# Patient Record
Sex: Male | Born: 1945 | Race: White | Hispanic: No | Marital: Married | Smoking: Former smoker
Health system: Southern US, Community
[De-identification: ages and names within clinical notes are randomized; demographics above are authoritative.]

## PROBLEM LIST (undated history)

## (undated) DIAGNOSIS — M1990 Unspecified osteoarthritis, unspecified site: Secondary | ICD-10-CM

## (undated) DIAGNOSIS — Z889 Allergy status to unspecified drugs, medicaments and biological substances status: Secondary | ICD-10-CM

## (undated) DIAGNOSIS — I499 Cardiac arrhythmia, unspecified: Secondary | ICD-10-CM

## (undated) DIAGNOSIS — I10 Essential (primary) hypertension: Secondary | ICD-10-CM

## (undated) DIAGNOSIS — E119 Type 2 diabetes mellitus without complications: Secondary | ICD-10-CM

## (undated) DIAGNOSIS — E0590 Thyrotoxicosis, unspecified without thyrotoxic crisis or storm: Secondary | ICD-10-CM

## (undated) DIAGNOSIS — K219 Gastro-esophageal reflux disease without esophagitis: Secondary | ICD-10-CM

## (undated) DIAGNOSIS — N200 Calculus of kidney: Secondary | ICD-10-CM

## (undated) HISTORY — PX: CERVICAL FUSION: SHX112

## (undated) HISTORY — PX: CARDIAC CATHETERIZATION: SHX172

## (undated) HISTORY — PX: APPENDECTOMY: SHX54

## (undated) HISTORY — PX: KNEE ARTHROSCOPY: SUR90

## (undated) HISTORY — PX: FRACTURE SURGERY: SHX138

## (undated) HISTORY — PX: TONSILLECTOMY: SUR1361

## (undated) HISTORY — PX: HERNIA REPAIR: SHX51

---

## 1997-08-21 ENCOUNTER — Ambulatory Visit (HOSPITAL_COMMUNITY): Admission: RE | Admit: 1997-08-21 | Discharge: 1997-08-21 | Payer: Self-pay | Admitting: Neurosurgery

## 1997-10-09 ENCOUNTER — Encounter: Payer: Self-pay | Admitting: Neurosurgery

## 1997-10-09 ENCOUNTER — Ambulatory Visit (HOSPITAL_COMMUNITY): Admission: RE | Admit: 1997-10-09 | Discharge: 1997-10-09 | Payer: Self-pay | Admitting: Neurosurgery

## 2003-09-18 ENCOUNTER — Other Ambulatory Visit: Payer: Self-pay

## 2004-02-22 ENCOUNTER — Ambulatory Visit: Payer: Self-pay | Admitting: Unknown Physician Specialty

## 2004-03-02 ENCOUNTER — Ambulatory Visit: Payer: Self-pay | Admitting: Unknown Physician Specialty

## 2004-03-30 ENCOUNTER — Ambulatory Visit: Payer: Self-pay | Admitting: Unknown Physician Specialty

## 2004-05-28 ENCOUNTER — Emergency Department: Payer: Self-pay | Admitting: Emergency Medicine

## 2004-10-05 ENCOUNTER — Emergency Department: Payer: Self-pay | Admitting: Emergency Medicine

## 2004-10-05 ENCOUNTER — Other Ambulatory Visit: Payer: Self-pay

## 2004-11-14 ENCOUNTER — Other Ambulatory Visit: Payer: Self-pay

## 2004-11-14 ENCOUNTER — Emergency Department: Payer: Self-pay | Admitting: Unknown Physician Specialty

## 2005-04-11 ENCOUNTER — Other Ambulatory Visit: Payer: Self-pay

## 2005-04-11 ENCOUNTER — Inpatient Hospital Stay: Payer: Self-pay | Admitting: Internal Medicine

## 2005-12-30 ENCOUNTER — Emergency Department: Payer: Self-pay | Admitting: Emergency Medicine

## 2005-12-30 ENCOUNTER — Other Ambulatory Visit: Payer: Self-pay

## 2007-02-18 ENCOUNTER — Ambulatory Visit: Payer: Self-pay | Admitting: Urology

## 2008-02-10 ENCOUNTER — Ambulatory Visit: Payer: Self-pay | Admitting: Family Medicine

## 2008-02-20 ENCOUNTER — Ambulatory Visit: Payer: Self-pay | Admitting: Family Medicine

## 2012-01-25 ENCOUNTER — Other Ambulatory Visit: Payer: Self-pay | Admitting: Surgical

## 2012-01-25 ENCOUNTER — Encounter (HOSPITAL_COMMUNITY): Payer: Self-pay | Admitting: Pharmacy Technician

## 2012-01-25 NOTE — Progress Notes (Signed)
Dr Darrelyn Hillock-  Need PRE OP ORDERS PLACED IN EPIC- PLEASE- Pt has appt 01/26/12   [facility]

## 2012-01-26 ENCOUNTER — Ambulatory Visit (HOSPITAL_COMMUNITY)
Admission: RE | Admit: 2012-01-26 | Discharge: 2012-01-26 | Disposition: A | Discharge status: Home / Self Care | Payer: PRIVATE HEALTH INSURANCE | Source: Ambulatory Visit | Attending: Surgical | Admitting: Surgical

## 2012-01-26 ENCOUNTER — Encounter (HOSPITAL_COMMUNITY)
Admission: RE | Admit: 2012-01-26 | Discharge: 2012-01-26 | Disposition: A | Discharge status: Home / Self Care | Payer: PRIVATE HEALTH INSURANCE | Source: Ambulatory Visit | Attending: Orthopedic Surgery | Admitting: Orthopedic Surgery

## 2012-01-26 ENCOUNTER — Encounter (HOSPITAL_COMMUNITY): Payer: Self-pay

## 2012-01-26 DIAGNOSIS — Z01818 Encounter for other preprocedural examination: Secondary | ICD-10-CM | POA: Insufficient documentation

## 2012-01-26 DIAGNOSIS — Z889 Allergy status to unspecified drugs, medicaments and biological substances status: Secondary | ICD-10-CM

## 2012-01-26 HISTORY — DX: Gastro-esophageal reflux disease without esophagitis: K21.9

## 2012-01-26 HISTORY — DX: Allergy status to unspecified drugs, medicaments and biological substances status: Z88.9

## 2012-01-26 HISTORY — DX: Type 2 diabetes mellitus without complications: E11.9

## 2012-01-26 HISTORY — DX: Cardiac arrhythmia, unspecified: I49.9

## 2012-01-26 HISTORY — DX: Allergy status to unspecified drugs, medicaments and biological substances: Z88.9

## 2012-01-26 HISTORY — DX: Calculus of kidney: N20.0

## 2012-01-26 HISTORY — DX: Thyrotoxicosis, unspecified without thyrotoxic crisis or storm: E05.90

## 2012-01-26 HISTORY — DX: Unspecified osteoarthritis, unspecified site: M19.90

## 2012-01-26 HISTORY — DX: Essential (primary) hypertension: I10

## 2012-01-26 LAB — URINALYSIS, ROUTINE W REFLEX MICROSCOPIC
Bilirubin Urine: NEGATIVE
Glucose, UA: 100 mg/dL — AB
Hgb urine dipstick: NEGATIVE
Ketones, ur: NEGATIVE mg/dL
Leukocytes, UA: NEGATIVE
Nitrite: NEGATIVE
Protein, ur: NEGATIVE mg/dL
Specific Gravity, Urine: 1.011 (ref 1.005–1.030)
Urobilinogen, UA: 0.2 mg/dL (ref 0.0–1.0)
pH: 6 (ref 5.0–8.0)

## 2012-01-26 LAB — PROTIME-INR
INR: 1.08 (ref 0.00–1.49)
Prothrombin Time: 13.9 seconds (ref 11.6–15.2)

## 2012-01-26 LAB — COMPREHENSIVE METABOLIC PANEL
ALT: 24 U/L (ref 0–53)
AST: 24 U/L (ref 0–37)
Albumin: 4.1 g/dL (ref 3.5–5.2)
Alkaline Phosphatase: 72 U/L (ref 39–117)
BUN: 14 mg/dL (ref 6–23)
CO2: 28 mEq/L (ref 19–32)
Calcium: 9.8 mg/dL (ref 8.4–10.5)
Chloride: 100 mEq/L (ref 96–112)
Creatinine, Ser: 0.86 mg/dL (ref 0.50–1.35)
GFR calc Af Amer: >90 mL/min (ref >90)
GFR calc non Af Amer: 90 mL/min — ABNORMAL LOW (ref >90)
Glucose, Bld: 148 mg/dL — ABNORMAL HIGH (ref 70–99)
Potassium: 4.4 mEq/L (ref 3.5–5.1)
Sodium: 136 mEq/L (ref 135–145)
Total Bilirubin: 0.3 mg/dL (ref 0.3–1.2)
Total Protein: 7.4 g/dL (ref 6.0–8.3)

## 2012-01-26 LAB — CBC
MCH: 32 pg (ref 26.0–34.0)
MCHC: 35.1 g/dL (ref 30.0–36.0)
MCV: 91.4 fL (ref 78.0–100.0)
Platelets: 234 10*3/uL (ref 150–400)
RBC: 4.65 MIL/uL (ref 4.22–5.81)
RDW: 12.4 % (ref 11.5–15.5)

## 2012-01-26 LAB — APTT: aPTT: 28 seconds (ref 24–37)

## 2012-01-26 LAB — SURGICAL PCR SCREEN: MRSA, PCR: NEGATIVE

## 2012-01-26 NOTE — Patient Instructions (Addendum)
22 Stephen Beasley  01/26/2012   Your procedure is scheduled on: 12-31--2013  Report to [facility] at      1000  AM   Call this number if you have problems the morning of surgery: [phone]  Or Presurgical Testing [phone](Donita Newland)   Remember: Bring any necessary supplies to manage Insulin Pump. Advise RN's at all times when making any changes to your insulin pump rate.    Do not eat food:After Midnight.  May have clear liquids:up to 6 Hours before arrival. Nothing after : 0700 AM  Clear liquids include soda, tea, black coffee, apple or grape juice, broth.  Take these medicines the morning of surgery with A SIP OF WATER:  Tylenol. Clonazepam. Nexium. Levothyroxine. Simvastatin. Sotalol. Bring and use Advair inhaler and Nasonex nasal spray.  Do not wear jewelry, make-up or nail polish.  Do not wear lotions, powders, or perfumes. You may wear deodorant.  Do not shave 48 hours prior to surgery.(face and neck okay, no shaving of legs)  Do not bring valuables to the hospital.  Contacts, dentures or bridgework,body piercing,  may not be worn into surgery.  Leave suitcase in the car. After surgery it may be brought to your room.  For patients admitted to the hospital, checkout time is 11:00 AM the day of discharge.   Patients discharged the day of surgery will not be allowed to drive home. Must have responsible person with you x 24 hours once discharged.  Name and phone number of your driver: Mia Winthrop, spouse [phone] cell  Special Instructions: CHG Shower Use Special Wash: see special instructions.(avoid face and genitals)   Please read over the following fact sheets that you were given: MRSA Information,  Incentive Spirometry Instruction.    Failure to follow these instructions may result in Cancellation of your surgery.   Patient signature_______________________________________________________

## 2012-01-26 NOTE — Pre-Procedure Instructions (Addendum)
01-26-12- EKG/ CXR done today. 01-26-12 Discussed Insulin Pump routine with Dr. Quincy Carnes MD not available between 01-25-12 to 02-01-12, MD has communicated promptly with pt. Via his smartphone, saying he was able to manage his own Insulin pump. I faxed and asked MD to send note by fax or to my email address. Pt. Signed Insulin pump contract. Saunders Glance 01-29-12 0950 -no email info or fax info received from endocrinologist MD.W. Zhanna Melin,RN Diabetes Coordinator @ [phone] made aware of pt. Having surgery.

## 2012-01-28 NOTE — H&P (Signed)
Stephen Beasley is an 66 y.o. male.   Chief Complaint: left shoulder pain HPI: The patient is a 66 year old male who presented with the chief complaint of left shoulder pain for several months with no known injury. He did responded minimally to cortisone injections. He was experiencing decreased strength as well as motion. MRI showed torn rotator cuff in the left shoulder with advanced AC joint arthritis.   Past Medical History  Diagnosis Date  . Diabetes mellitus without complication     Insulin Pump (Medtronic Paradigm) being used  . Hypertension   . Dysrhythmia     Premature PVC's, and skipped beats- Dr. Arvilla Market follows -[facility]  . Hyperthyroidism   . Multiple allergies 01-26-12    Advair for allergies  . Kidney stone     40 yrs ago  . GERD (gastroesophageal reflux disease)   . Arthritis     mild hands, shoulders    Past Surgical History  Procedure Date  . Cardiac catheterization     6 yrs ago -negative for blockages  . Tonsillectomy   . Fracture surgery     Right ankle retained hardware  . Appendectomy   . Hernia repair     RIH x1, Umbilical X2  . Knee arthroscopy     right knee  . Cervical fusion     no retained hardware    No family history on file. Social History:  reports that he quit smoking about 30 years ago. His smoking use included Cigarettes. He smoked .5 packs per day. He does not have any smokeless tobacco history on file. He reports that he drinks alcohol. He reports that he does not use illicit drugs.  Allergies:  Allergies  Allergen Reactions  . Augmentin (Amoxicillin-Pot Clavulanate) Nausea And Vomiting  . Chocolate Nausea And Vomiting    White chocolate  . Lipitor (Atorvastatin) Other (See Comments)    Muscle cramps   . Current outpatient prescriptions: acetaminophen (TYLENOL) 500 MG tablet, Take 1,000 mg by mouth every 6 (six) hours as needed. For pain, Disp: , Rfl: ;   aspirin 325 MG tablet, Take 325 mg by mouth daily., Disp: , Rfl: ;   clonazePAM (KLONOPIN) 1 MG tablet, Take 1 mg by mouth 2 (two) times daily as needed. For anxiety, Disp: , Rfl: ;   esomeprazole (NEXIUM) 40 MG capsule, Take 40 mg by mouth daily before breakfast., Disp: , Rfl:  Fluticasone-Salmeterol (ADVAIR) 250-50 MCG/DOSE AEPB, Inhale 1 puff into the lungs daily., Disp: , Rfl: ;   insulin aspart (NOVOLOG) 100 UNIT/ML injection, Inject into the skin See admin instructions. For insulin pump. Rate of 1.8 units per hour. Sugar is below 60, stop insulin. Use sliding scale around meals depending on carb intake. Varies between 3 units and 19 units., Disp: , Rfl:  levothyroxine (SYNTHROID, LEVOTHROID) 137 MCG tablet, Take 137 mcg by mouth every morning., Disp: , Rfl: ;  mometasone (NASONEX) 50 MCG/ACT nasal spray, Place 2 sprays into the nose daily., Disp: , Rfl: ;  Multiple Vitamin (MULTIVITAMIN WITH MINERALS) TABS, Take 1 tablet by mouth daily., Disp: , Rfl: ;  ramipril (ALTACE) 10 MG capsule, Take 20 mg by mouth daily., Disp: , Rfl:  simvastatin (ZOCOR) 20 MG tablet, Take 20 mg by mouth every morning., Disp: , Rfl: ;   sotalol (BETAPACE) 80 MG tablet, Take 80 mg by mouth 2 (two) times daily., Disp: , Rfl:   Review of Systems  Constitutional: Negative.   HENT: Negative.  Negative for  neck pain.   Eyes: Negative.   Respiratory: Negative.   Cardiovascular: Negative.   Gastrointestinal: Negative.   Genitourinary: Negative.   Musculoskeletal: Positive for joint pain. Negative for myalgias, back pain and falls.       Left shoulder pain  Skin: Negative.   Neurological: Negative.   Endo/Heme/Allergies: Negative.   Psychiatric/Behavioral: Negative.     Vitals Weight: 255 lb Height: 76 in Body Surface Area: 2.49 m Body Mass Index: 31.04 kg/m Pulse: 60 (Regular) BP: 125/86 (Sitting, Left Arm, Standard  Physical Exam  Constitutional: He is oriented to person, place, and time. He appears well-developed and well-nourished. No distress.  HENT:  Head:  Normocephalic and atraumatic.  Right Ear: External ear normal.  Left Ear: External ear normal.  Nose: Nose normal.  Mouth/Throat: Oropharynx is clear and moist.  Eyes: Conjunctivae normal and EOM are normal.  Neck: Normal range of motion. Neck supple. No tracheal deviation present. No thyromegaly present.  Cardiovascular: Normal rate, regular rhythm, normal heart sounds and intact distal pulses.   No murmur heard. Respiratory: Effort normal and breath sounds normal. No respiratory distress. He has no wheezes. He exhibits no tenderness.  GI: Soft. Bowel sounds are normal. He exhibits no distension and no mass. There is no tenderness.  Musculoskeletal:       Right shoulder: Normal.       Left shoulder: He exhibits decreased range of motion, tenderness, crepitus, pain and decreased strength.       Right elbow: Normal.      Left elbow: Normal.       Cervical back: Normal.       Arms: Lymphadenopathy:    He has no cervical adenopathy.  Neurological: He is alert and oriented to person, place, and time. He has normal reflexes. No sensory deficit.       Decreased strength in the left shoulder  Skin: No rash noted. He is not diaphoretic. No erythema.  Psychiatric: He has a normal mood and affect. His behavior is normal.     Assessment/Plan He has a torn rotator cuff at the insertion of the cuff in the left shoulder in addition to advanced AC joint arthritis. He needs an open left shoulder rotator cuff repair with decompression and resection of AC joint, with possible use of graft and anchors. He will stay in observation overnight for this.     Luanne Krzyzanowski LAUREN 01/28/2012, 4:59 PM

## 2012-01-30 ENCOUNTER — Encounter (HOSPITAL_COMMUNITY)
Admission: RE | Disposition: A | Discharge status: Home / Self Care | Payer: Self-pay | Source: Ambulatory Visit | Attending: Orthopedic Surgery

## 2012-01-30 ENCOUNTER — Observation Stay (HOSPITAL_COMMUNITY)
Admission: RE | Admit: 2012-01-30 | Discharge: 2012-01-31 | Disposition: A | Discharge status: Home / Self Care | Payer: PRIVATE HEALTH INSURANCE | Source: Ambulatory Visit | Attending: Orthopedic Surgery | Admitting: Orthopedic Surgery

## 2012-01-30 ENCOUNTER — Encounter (HOSPITAL_COMMUNITY): Payer: Self-pay | Admitting: Anesthesiology

## 2012-01-30 ENCOUNTER — Encounter (HOSPITAL_COMMUNITY): Payer: Self-pay | Admitting: *Deleted

## 2012-01-30 ENCOUNTER — Ambulatory Visit (HOSPITAL_COMMUNITY): Payer: PRIVATE HEALTH INSURANCE | Admitting: Anesthesiology

## 2012-01-30 DIAGNOSIS — M25819 Other specified joint disorders, unspecified shoulder: Secondary | ICD-10-CM | POA: Insufficient documentation

## 2012-01-30 DIAGNOSIS — Z79899 Other long term (current) drug therapy: Secondary | ICD-10-CM | POA: Insufficient documentation

## 2012-01-30 DIAGNOSIS — Z7982 Long term (current) use of aspirin: Secondary | ICD-10-CM | POA: Insufficient documentation

## 2012-01-30 DIAGNOSIS — I10 Essential (primary) hypertension: Secondary | ICD-10-CM | POA: Insufficient documentation

## 2012-01-30 DIAGNOSIS — M7542 Impingement syndrome of left shoulder: Secondary | ICD-10-CM | POA: Diagnosis present

## 2012-01-30 DIAGNOSIS — M19019 Primary osteoarthritis, unspecified shoulder: Secondary | ICD-10-CM | POA: Diagnosis present

## 2012-01-30 DIAGNOSIS — E109 Type 1 diabetes mellitus without complications: Secondary | ICD-10-CM

## 2012-01-30 DIAGNOSIS — M67919 Unspecified disorder of synovium and tendon, unspecified shoulder: Principal | ICD-10-CM | POA: Insufficient documentation

## 2012-01-30 DIAGNOSIS — K219 Gastro-esophageal reflux disease without esophagitis: Secondary | ICD-10-CM | POA: Insufficient documentation

## 2012-01-30 DIAGNOSIS — E119 Type 2 diabetes mellitus without complications: Secondary | ICD-10-CM | POA: Insufficient documentation

## 2012-01-30 DIAGNOSIS — M719 Bursopathy, unspecified: Principal | ICD-10-CM | POA: Insufficient documentation

## 2012-01-30 HISTORY — PX: SHOULDER OPEN ROTATOR CUFF REPAIR: SHX2407

## 2012-01-30 LAB — GLUCOSE, CAPILLARY: Glucose-Capillary: 271 mg/dL — ABNORMAL HIGH (ref 70–99)

## 2012-01-30 SURGERY — REPAIR, ROTATOR CUFF, OPEN
Anesthesia: General | Site: Shoulder | Laterality: Left | Wound class: Clean

## 2012-01-30 MED ORDER — ACETAMINOPHEN 325 MG PO TABS: 650.0000 mg | ORAL_TABLET | Freq: Four times a day (QID) | ORAL | Status: DC | PRN

## 2012-01-30 MED ORDER — POLYETHYLENE GLYCOL 3350 17 G PO PACK: 17.0000 g | PACK | Freq: Every day | ORAL | Status: DC | PRN

## 2012-01-30 MED ORDER — LACTATED RINGERS IV SOLN
INTRAVENOUS | Status: DC | PRN
  Administered 2012-01-30 (×2): via INTRAVENOUS

## 2012-01-30 MED ORDER — CLINDAMYCIN PHOSPHATE 600 MG/50ML IV SOLN
600.0000 mg | Freq: Four times a day (QID) | INTRAVENOUS | Status: AC
  Administered 2012-01-30 – 2012-01-31 (×3): 600 mg via INTRAVENOUS
  Filled 2012-01-30 (×3): qty 50

## 2012-01-30 MED ORDER — RAMIPRIL 10 MG PO CAPS
20.0000 mg | ORAL_CAPSULE | Freq: Every day | ORAL | Status: DC
  Administered 2012-01-31: 20 mg via ORAL
  Filled 2012-01-30 (×2): qty 2

## 2012-01-30 MED ORDER — LIDOCAINE HCL (CARDIAC) 20 MG/ML IV SOLN
INTRAVENOUS | Status: DC | PRN
  Administered 2012-01-30: 60 mg via INTRAVENOUS

## 2012-01-30 MED ORDER — ONDANSETRON HCL 4 MG PO TABS: 4.0000 mg | ORAL_TABLET | Freq: Four times a day (QID) | ORAL | Status: DC | PRN

## 2012-01-30 MED ORDER — FENTANYL CITRATE 0.05 MG/ML IJ SOLN
INTRAMUSCULAR | Status: DC | PRN
  Administered 2012-01-30: 100 ug via INTRAVENOUS

## 2012-01-30 MED ORDER — PROMETHAZINE HCL 25 MG/ML IJ SOLN: 6.2500 mg | INTRAMUSCULAR | Status: DC | PRN

## 2012-01-30 MED ORDER — LACTATED RINGERS IV SOLN
INTRAVENOUS | Status: DC
  Administered 2012-01-30 – 2012-01-31 (×2): via INTRAVENOUS

## 2012-01-30 MED ORDER — BISACODYL 10 MG RE SUPP: 10.0000 mg | Freq: Every day | RECTAL | Status: DC | PRN

## 2012-01-30 MED ORDER — INSULIN ASPART 100 UNIT/ML ~~LOC~~ SOLN: 0.0000 [IU] | Freq: Three times a day (TID) | SUBCUTANEOUS | Status: DC

## 2012-01-30 MED ORDER — SUCCINYLCHOLINE CHLORIDE 20 MG/ML IJ SOLN
INTRAMUSCULAR | Status: DC | PRN
  Administered 2012-01-30: 140 mg via INTRAVENOUS

## 2012-01-30 MED ORDER — ACETAMINOPHEN 10 MG/ML IV SOLN
INTRAVENOUS | Status: DC | PRN
  Administered 2012-01-30: 1000 mg via INTRAVENOUS

## 2012-01-30 MED ORDER — INSULIN GLARGINE 100 UNIT/ML ~~LOC~~ SOLN: 36.0000 [IU] | Freq: Every day | SUBCUTANEOUS | Status: DC

## 2012-01-30 MED ORDER — MOMETASONE FURO-FORMOTEROL FUM 100-5 MCG/ACT IN AERO
2.0000 | INHALATION_SPRAY | Freq: Two times a day (BID) | RESPIRATORY_TRACT | Status: DC
  Administered 2012-01-30: 2 via RESPIRATORY_TRACT
  Filled 2012-01-30: qty 8.8

## 2012-01-30 MED ORDER — OXYCODONE-ACETAMINOPHEN 5-325 MG PO TABS
1.0000 | ORAL_TABLET | ORAL | Status: DC | PRN
  Administered 2012-01-30: 1 via ORAL
  Administered 2012-01-31: 2 via ORAL
  Filled 2012-01-30: qty 2
  Filled 2012-01-30: qty 1

## 2012-01-30 MED ORDER — ONDANSETRON HCL 4 MG/2ML IJ SOLN
INTRAMUSCULAR | Status: DC | PRN
  Administered 2012-01-30: 4 mg via INTRAVENOUS

## 2012-01-30 MED ORDER — INSULIN PUMP
Freq: Three times a day (TID) | SUBCUTANEOUS | Status: DC
  Administered 2012-01-30 – 2012-01-31 (×3): via SUBCUTANEOUS
  Filled 2012-01-30: qty 1

## 2012-01-30 MED ORDER — SODIUM CHLORIDE 0.9 % IR SOLN
Status: DC | PRN
  Administered 2012-01-30: 14:00:00

## 2012-01-30 MED ORDER — METHOCARBAMOL 100 MG/ML IJ SOLN
500.0000 mg | Freq: Four times a day (QID) | INTRAVENOUS | Status: DC | PRN
  Filled 2012-01-30: qty 5

## 2012-01-30 MED ORDER — SIMVASTATIN 20 MG PO TABS
20.0000 mg | ORAL_TABLET | Freq: Every evening | ORAL | Status: DC
  Filled 2012-01-30: qty 1

## 2012-01-30 MED ORDER — HYDROMORPHONE HCL PF 1 MG/ML IJ SOLN: 0.2500 mg | INTRAMUSCULAR | Status: DC | PRN

## 2012-01-30 MED ORDER — SOTALOL HCL 80 MG PO TABS
80.0000 mg | ORAL_TABLET | Freq: Two times a day (BID) | ORAL | Status: DC
  Administered 2012-01-30 – 2012-01-31 (×2): 80 mg via ORAL
  Filled 2012-01-30 (×4): qty 1

## 2012-01-30 MED ORDER — HYDROMORPHONE HCL PF 1 MG/ML IJ SOLN
INTRAMUSCULAR | Status: DC | PRN
  Administered 2012-01-30 (×2): 1 mg via INTRAVENOUS

## 2012-01-30 MED ORDER — FLEET ENEMA 7-19 GM/118ML RE ENEM: 1.0000 | ENEMA | Freq: Once | RECTAL | Status: AC | PRN

## 2012-01-30 MED ORDER — LEVOTHYROXINE SODIUM 137 MCG PO TABS
137.0000 ug | ORAL_TABLET | Freq: Every day | ORAL | Status: DC
  Administered 2012-01-31: 137 ug via ORAL
  Filled 2012-01-30 (×2): qty 1

## 2012-01-30 MED ORDER — METHOCARBAMOL 500 MG PO TABS
500.0000 mg | ORAL_TABLET | Freq: Four times a day (QID) | ORAL | Status: DC | PRN
  Administered 2012-01-31 (×2): 500 mg via ORAL
  Filled 2012-01-30 (×2): qty 1

## 2012-01-30 MED ORDER — INSULIN ASPART 100 UNIT/ML ~~LOC~~ SOLN
0.0000 [IU] | SUBCUTANEOUS | Status: DC
  Administered 2012-01-30: 11 [IU] via SUBCUTANEOUS
  Filled 2012-01-30: qty 1

## 2012-01-30 MED ORDER — OXYCODONE-ACETAMINOPHEN 5-325 MG PO TABS: 2.0000 | ORAL_TABLET | ORAL | Status: DC | PRN

## 2012-01-30 MED ORDER — MIDAZOLAM HCL 5 MG/5ML IJ SOLN
INTRAMUSCULAR | Status: DC | PRN
  Administered 2012-01-30 (×2): 1 mg via INTRAVENOUS

## 2012-01-30 MED ORDER — LACTATED RINGERS IV SOLN
INTRAVENOUS | Status: DC
  Administered 2012-01-30: 11:00:00 via INTRAVENOUS

## 2012-01-30 MED ORDER — ONDANSETRON HCL 4 MG/2ML IJ SOLN: 4.0000 mg | Freq: Four times a day (QID) | INTRAMUSCULAR | Status: DC | PRN

## 2012-01-30 MED ORDER — HYDROMORPHONE HCL PF 1 MG/ML IJ SOLN
0.5000 mg | INTRAMUSCULAR | Status: DC | PRN
  Administered 2012-01-30: 1 mg via INTRAVENOUS
  Filled 2012-01-30: qty 1

## 2012-01-30 MED ORDER — CLINDAMYCIN PHOSPHATE 900 MG/50ML IV SOLN
900.0000 mg | INTRAVENOUS | Status: AC
  Administered 2012-01-30: 900 mg via INTRAVENOUS
  Filled 2012-01-30: qty 50

## 2012-01-30 MED ORDER — PANTOPRAZOLE SODIUM 40 MG PO TBEC
40.0000 mg | DELAYED_RELEASE_TABLET | Freq: Every day | ORAL | Status: DC
  Administered 2012-01-31: 40 mg via ORAL
  Filled 2012-01-30: qty 1

## 2012-01-30 MED ORDER — BUPIVACAINE LIPOSOME 1.3 % IJ SUSP
20.0000 mL | INTRAMUSCULAR | Status: AC
  Administered 2012-01-30: 20 mL
  Filled 2012-01-30: qty 20

## 2012-01-30 MED ORDER — PHENOL 1.4 % MT LIQD: 1.0000 | OROMUCOSAL | Status: DC | PRN

## 2012-01-30 MED ORDER — LACTATED RINGERS IV SOLN: INTRAVENOUS | Status: DC

## 2012-01-30 MED ORDER — CLONAZEPAM 1 MG PO TABS
1.0000 mg | ORAL_TABLET | Freq: Two times a day (BID) | ORAL | Status: DC | PRN
  Administered 2012-01-30 – 2012-01-31 (×2): 1 mg via ORAL
  Filled 2012-01-30 (×2): qty 1

## 2012-01-30 MED ORDER — ACETAMINOPHEN 650 MG RE SUPP: 650.0000 mg | Freq: Four times a day (QID) | RECTAL | Status: DC | PRN

## 2012-01-30 MED ORDER — FLUTICASONE PROPIONATE 50 MCG/ACT NA SUSP
2.0000 | Freq: Every day | NASAL | Status: DC
  Administered 2012-01-31: 2 via NASAL
  Filled 2012-01-30: qty 16

## 2012-01-30 MED ORDER — MENTHOL 3 MG MT LOZG: 1.0000 | LOZENGE | OROMUCOSAL | Status: DC | PRN

## 2012-01-30 MED ORDER — THROMBIN 5000 UNITS EX SOLR
CUTANEOUS | Status: DC | PRN
  Administered 2012-01-30: 5000 [IU] via TOPICAL

## 2012-01-30 MED ORDER — PROPOFOL 10 MG/ML IV BOLUS
INTRAVENOUS | Status: DC | PRN
  Administered 2012-01-30: 200 mg via INTRAVENOUS

## 2012-01-30 SURGICAL SUPPLY — 48 items
BAG SPEC THK2 15X12 ZIP CLS (MISCELLANEOUS) ×1
BAG ZIPLOCK 12X15 (MISCELLANEOUS) ×2 IMPLANT
BLADE OSCILLATING/SAGITTAL (BLADE) ×2
BLADE SW THK.38XMED LNG THN (BLADE) ×1 IMPLANT
BNDG COHESIVE 6X5 TAN NS LF (GAUZE/BANDAGES/DRESSINGS) ×2 IMPLANT
BUR OVAL CARBIDE 4.0 (BURR) ×2 IMPLANT
CLEANER TIP ELECTROSURG 2X2 (MISCELLANEOUS) ×2 IMPLANT
CLOTH BEACON ORANGE TIMEOUT ST (SAFETY) ×2 IMPLANT
CLSR STERI-STRIP ANTIMIC 1/2X4 (GAUZE/BANDAGES/DRESSINGS) ×2 IMPLANT
DRAPE POUCH INSTRU U-SHP 10X18 (DRAPES) ×2 IMPLANT
DRSG EMULSION OIL 3X3 NADH (GAUZE/BANDAGES/DRESSINGS) ×2 IMPLANT
DRSG PAD ABDOMINAL 8X10 ST (GAUZE/BANDAGES/DRESSINGS) ×2 IMPLANT
DURAPREP 26ML APPLICATOR (WOUND CARE) ×2 IMPLANT
ELECT REM PT RETURN 9FT ADLT (ELECTROSURGICAL) ×2
ELECTRODE REM PT RTRN 9FT ADLT (ELECTROSURGICAL) ×1 IMPLANT
FLOSEAL 10ML (HEMOSTASIS) IMPLANT
GLOVE BIOGEL PI IND STRL 8 (GLOVE) ×1 IMPLANT
GLOVE BIOGEL PI IND STRL 8.5 (GLOVE) ×1 IMPLANT
GLOVE BIOGEL PI INDICATOR 8 (GLOVE) ×1
GLOVE BIOGEL PI INDICATOR 8.5 (GLOVE) ×1
GLOVE ECLIPSE 8.0 STRL XLNG CF (GLOVE) ×4 IMPLANT
GLOVE SURG SS PI 6.5 STRL IVOR (GLOVE) ×4 IMPLANT
GOWN PREVENTION PLUS LG XLONG (DISPOSABLE) ×4 IMPLANT
GOWN STRL REIN XL XLG (GOWN DISPOSABLE) ×4 IMPLANT
KIT BASIN OR (CUSTOM PROCEDURE TRAY) ×2 IMPLANT
MANIFOLD NEPTUNE II (INSTRUMENTS) ×2 IMPLANT
NEEDLE MA TROC 1/2 (NEEDLE) IMPLANT
NS IRRIG 1000ML POUR BTL (IV SOLUTION) IMPLANT
PACK SHOULDER CUSTOM OPM052 (CUSTOM PROCEDURE TRAY) ×2 IMPLANT
PASSER SUT SWANSON 36MM LOOP (INSTRUMENTS) IMPLANT
POSITIONER SURGICAL ARM (MISCELLANEOUS) ×2 IMPLANT
SLING ARM IMMOBILIZER LRG (SOFTGOODS) ×2 IMPLANT
SPONGE GAUZE 4X4 12PLY (GAUZE/BANDAGES/DRESSINGS) ×2 IMPLANT
SPONGE SURGIFOAM ABS GEL 100 (HEMOSTASIS) ×2 IMPLANT
STAPLER VISISTAT 35W (STAPLE) ×2 IMPLANT
STRIP CLOSURE SKIN 1/2X4 (GAUZE/BANDAGES/DRESSINGS) ×2 IMPLANT
SUCTION FRAZIER 12FR DISP (SUCTIONS) ×2 IMPLANT
SUT BONE WAX W31G (SUTURE) ×2 IMPLANT
SUT ETHIBOND NAB CT1 #1 30IN (SUTURE) IMPLANT
SUT MNCRL AB 4-0 PS2 18 (SUTURE) ×2 IMPLANT
SUT VIC AB 0 CT1 27 (SUTURE) ×2
SUT VIC AB 0 CT1 27XBRD ANTBC (SUTURE) ×1 IMPLANT
SUT VIC AB 1 CT1 27 (SUTURE) ×4
SUT VIC AB 1 CT1 27XBRD ANTBC (SUTURE) ×2 IMPLANT
SUT VIC AB 2-0 CT1 27 (SUTURE)
SUT VIC AB 2-0 CT1 27XBRD (SUTURE) IMPLANT
TAPE CLOTH SURG 6X10 WHT LF (GAUZE/BANDAGES/DRESSINGS) ×2 IMPLANT
TOWEL OR 17X26 10 PK STRL BLUE (TOWEL DISPOSABLE) ×4 IMPLANT

## 2012-01-30 NOTE — H&P (View-Only) (Signed)
Inpatient Diabetes Program Recommendations  AACE/ADA: New Consensus Statement on Inpatient Glycemic Control (2013)  Target Ranges:  Prepandial:   less than 140 mg/dL      Peak postprandial:   less than 180 mg/dL (1-2 hours)      Critically ill patients:  140 - 180 mg/dL   Reason for Visit: Hyperglycemia pre-op in patient that usually uses an insulin pump to manage diabetes.  Note:  Patient scheduled for shoulder surgery today.  Per patient, his endocrinologist from [facility], Dr. Nicole Jelesoff, sent a letter electronically to Dr. Gioffre outlining recommendations for subcutaneous insulin injections while off the insulin pump.  Patient was advised by Dr. Jelesoff to stop his insulin pump last night and take Lantus 36 units.  This morning at home, CBG's reportedly 200 to 220 mg/dl.  Upon arrival on Short Stay, CBG was 300 mg/dl.  Received Novolog 11 units at 10:40 per resistant scale.  Should glucose level not respond in a timely way to subcutaneous insulin, please consider the GlucoStabilizer for IV insulin-- even though his surgery is estimated to last only 1 1/2 hours.  Patient's insulin pump was left at home because Dr. Jelesoff plans for him to resume pump after he returns home.  At first patient states he doesn't remember pump settings-- but later states he receives a flat rate of Novolog 1.5 units hourly for a total of 36 units Novolog every 24 hours as basal insulin.  (Per PA note, patient's rate is 1.8 units/hour.)  Theoretically the dose of Lantus 36 units should have been adequate, but sometimes it takes more than one dose to see full effect of Lantus.  Will need at least Lantus 36 units at HS nightly until discharged-- or dosage as recommended per Dr. Jelesoff.  Patient states that he usually takes 1 unit Novolog to cover every 6 GM CHO.  CHO Modified Medium diet provides 60 GM CHO per meal, so 10 units of meal coverage tid with meals would be home ratio.  Would recommend Novolog 10 units tid  with meals as meal coverage, or dosage recommended per Dr. Jelesoff.   Patient doesn't remember the correction factor set in his bolus calculator, but sounds as though he has insulin resistance, even though he is type 1.  Would recommend resistant or moderate scale, or correction recommended by Dr. Jelesoff.  Thank you.  Codee Bloodworth S. Zahir Eisenhour, RN, CNS, CDE Inpatient Diabetes Program, team pager [phone]        

## 2012-01-30 NOTE — Consult Note (Signed)
[facility] Medical Consultation  Stephen Beasley ZOX:[id number] DOB: 1945-06-17 DOA: 01/30/2012 PCP: Provider Not In System   Requesting physician: Ranee Gosselin Date of consultation: 01/30/12 Reason for consultation: diabetes management   Impression/Recommendations  1. DM type I on an insulin pump at home. We recommend the patient bring his own pump and start using it tonight. If he is unable to set it up then would recommend give 35 units of lantus tonight and place ssi sensitive novolog with meals.  I will followup again tomorrow. Please contact me if I can be of assistance in the meanwhile. Thank you for this consultation.  Chief Complaint: here for shoulder surgery , needs insulin titration for his diabetes   HPI:  66 yo man who underwent surgery on his shoulder today . We were called to help with diabetes management   Review of Systems:  C/o some postop shoulder pain   Past Medical History  Diagnosis Date  . Diabetes mellitus without complication     Insulin Pump (Medtronic Paradigm) being used  . Hypertension   . Dysrhythmia     Premature PVC's, and skipped beats- Dr. Arvilla Market follows -[facility]  . Hyperthyroidism   . Multiple allergies 01-26-12    Advair for allergies  . Kidney stone     40 yrs ago  . GERD (gastroesophageal reflux disease)   . Arthritis     mild hands, shoulders   Past Surgical History  Procedure Date  . Cardiac catheterization     6 yrs ago -negative for blockages  . Tonsillectomy   . Fracture surgery     Right ankle retained hardware  . Appendectomy   . Hernia repair     RIH x1, Umbilical X2  . Knee arthroscopy     right knee  . Cervical fusion     no retained hardware   Social History:  reports that he quit smoking about 30 years ago. His smoking use included Cigarettes. He smoked .5 packs per day. He does not have any smokeless tobacco history on file. He reports that he drinks alcohol. He reports that he does not use illicit  drugs.  Allergies  Allergen Reactions  . Augmentin (Amoxicillin-Pot Clavulanate) Nausea And Vomiting  . Chocolate Nausea And Vomiting    White chocolate  . Lipitor (Atorvastatin) Other (See Comments)    Muscle cramps   History reviewed. No pertinent family history.  Prior to Admission medications   Medication Sig Start Date End Date Taking? Authorizing Provider  acetaminophen (TYLENOL) 500 MG tablet Take 1,000 mg by mouth every 6 (six) hours as needed. For pain   Yes Historical Provider, MD  aspirin 325 MG tablet Take 325 mg by mouth daily.   Yes Historical Provider, MD  clonazePAM (KLONOPIN) 1 MG tablet Take 1 mg by mouth 2 (two) times daily as needed. For anxiety   Yes Historical Provider, MD  esomeprazole (NEXIUM) 40 MG capsule Take 40 mg by mouth daily before breakfast.   Yes Historical Provider, MD  Fluticasone-Salmeterol (ADVAIR) 250-50 MCG/DOSE AEPB Inhale 1 puff into the lungs daily.   Yes Historical Provider, MD  insulin aspart (NOVOLOG) 100 UNIT/ML injection Inject into the skin See admin instructions. For insulin pump. Rate of 1.8 units per hour. Sugar is below 60, stop insulin. Use sliding scale around meals depending on carb intake. Varies between 3 units and 19 units.   Yes Historical Provider, MD  insulin glargine (LANTUS) 100 UNIT/ML injection Inject 36 Units into the skin once.  Yes Historical Provider, MD  levothyroxine (SYNTHROID, LEVOTHROID) 137 MCG tablet Take 137 mcg by mouth every morning.   Yes Historical Provider, MD  mometasone (NASONEX) 50 MCG/ACT nasal spray Place 2 sprays into the nose daily.   Yes Historical Provider, MD  Multiple Vitamin (MULTIVITAMIN WITH MINERALS) TABS Take 1 tablet by mouth daily.   Yes Historical Provider, MD  ramipril (ALTACE) 10 MG capsule Take 20 mg by mouth daily.   Yes Historical Provider, MD  simvastatin (ZOCOR) 20 MG tablet Take 20 mg by mouth every morning.   Yes Historical Provider, MD  sotalol (BETAPACE) 80 MG tablet Take 80 mg  by mouth 2 (two) times daily.   Yes Historical Provider, MD   Physical Exam: Blood pressure 145/85, pulse 57, temperature 98.5 F (36.9 C), temperature source Oral, resp. rate 11, SpO2 94.00%. Filed Vitals:   01/30/12 1500 01/30/12 1515 01/30/12 1530 01/30/12 1545  BP: 147/87 140/83 133/76 145/85  Pulse: 58 57 56 57  Temp:   98.3 F (36.8 C) 98.5 F (36.9 C)  TempSrc:      Resp: 14 12 11    SpO2: 100% 95% 94% 94%     General:  axox3  Eyes: perrla, eomi  ENT: clear pharynx  Neck: no JVD  Cardiovascular: RRR, no Mr,g,   Respiratory: ctab no w,r,c   Abdomen: soft, nt   Skin: warm dry  Musculoskeletal: left shoulder in sling   Psychiatric: euthymic  Neurologic: cn 2-12 intact   Labs on Admission:  Basic Metabolic Panel:  Lab 01/26/12 1610  NA 136  K 4.4  CL 100  CO2 28  GLUCOSE 148*  BUN 14  CREATININE 0.86  CALCIUM 9.8  MG --  PHOS --   Liver Function Tests:  Lab 01/26/12 1145  AST 24  ALT 24  ALKPHOS 72  BILITOT 0.3  PROT 7.4  ALBUMIN 4.1   No results found for this basename: LIPASE:5,AMYLASE:5 in the last 168 hours No results found for this basename: AMMONIA:5 in the last 168 hours CBC:  Lab 01/26/12 1145  WBC 7.3  NEUTROABS --  HGB 14.9  HCT 42.5  MCV 91.4  PLT 234   Cardiac Enzymes: No results found for this basename: CKTOTAL:5,CKMB:5,CKMBINDEX:5,TROPONINI:5 in the last 168 hours BNP: No components found with this basename: POCBNP:5 CBG:  Lab 01/30/12 1614 01/30/12 1535 01/30/12 1356 01/30/12 1146 01/30/12 1015  GLUCAP 109* 108* 163* 271* 300*    Radiological Exams on Admission: No results found.   Karsen Fellows [facility] Pager [phone]  If 7PM-7AM, please contact night-coverage [email or web address] Password [email or web address] 01/30/2012, 4:45 PM

## 2012-01-30 NOTE — Anesthesia Preprocedure Evaluation (Addendum)
Anesthesia Evaluation  Patient identified by MRN, date of birth, ID band Patient awake    Reviewed: Allergy & Precautions, H&P , NPO status , Patient's Chart, lab work & pertinent test results  Airway Mallampati: II TM Distance: >3 FB Neck ROM: Full    Dental  (+) Teeth Intact and Dental Advisory Given   Pulmonary neg pulmonary ROS,  breath sounds clear to auscultation  Pulmonary exam normal       Cardiovascular hypertension, Pt. on medications and Pt. on home beta blockers negative cardio ROS  + dysrhythmias Rhythm:Regular Rate:Normal     Neuro/Psych negative neurological ROS  negative psych ROS   GI/Hepatic negative GI ROS, Neg liver ROS, GERD-  ,  Endo/Other  negative endocrine ROSdiabetes, Well Controlled, Type 1, Insulin DependentHypothyroidism Morbid obesity  Renal/GU Renal diseasenegative Renal ROS  negative genitourinary   Musculoskeletal negative musculoskeletal ROS (+)   Abdominal   Peds negative pediatric ROS (+)  Hematology negative hematology ROS (+)   Anesthesia Other Findings   Reproductive/Obstetrics negative OB ROS                          Anesthesia Physical Anesthesia Plan  ASA: III  Anesthesia Plan: General   Post-op Pain Management:    Induction: Intravenous  Airway Management Planned: Oral ETT  Additional Equipment:   Intra-op Plan:   Post-operative Plan: Extubation in OR  Informed Consent: I have reviewed the patients History and Physical, chart, labs and discussed the procedure including the risks, benefits and alternatives for the proposed anesthesia with the patient or authorized representative who has indicated his/her understanding and acceptance.   Dental advisory given  Plan Discussed with: CRNA  Anesthesia Plan Comments:         Anesthesia Quick Evaluation

## 2012-01-30 NOTE — Interval H&P Note (Signed)
History and Physical Interval Note:  01/30/2012 1:01 PM  Stephen Beasley  has presented today for surgery, with the diagnosis of left shoulder rotator cuff tear   The various methods of treatment have been discussed with the patient and family. After consideration of risks, benefits and other options for treatment, the patient has consented to  Procedure(s) (LRB) with comments: ROTATOR CUFF REPAIR SHOULDER OPEN (Left) - Left Shoulder Rotator Cuff Repair Decompression of Shoulder Resection AC Joint as a surgical intervention .  The patient's history has been reviewed, patient examined, no change in status, stable for surgery.  I have reviewed the patient's chart and labs.  Questions were answered to the patient's satisfaction.     Avice Funchess A

## 2012-01-30 NOTE — Transfer of Care (Signed)
Immediate Anesthesia Transfer of Care Note  Patient: Stephen Beasley  Procedure(s) Performed: Procedure(s) (LRB) with comments: ROTATOR CUFF REPAIR SHOULDER OPEN (Left) - Left Shoulder open Rotator Cuff with acrominectomy and distal clavicle resection.   Patient Location: PACU  Anesthesia Type:General  Level of Consciousness: sedated, patient cooperative and responds to stimulation  Airway & Oxygen Therapy: Patient Spontanous Breathing and Patient connected to face mask oxygen  Post-op Assessment: Report given to PACU RN and Post -op Vital signs reviewed and stable  Post vital signs: Reviewed and stable  Complications: No apparent anesthesia complications

## 2012-01-30 NOTE — Anesthesia Postprocedure Evaluation (Signed)
Anesthesia Post Note  Patient: Stephen Beasley  Procedure(s) Performed: Procedure(s) (LRB): ROTATOR CUFF REPAIR SHOULDER OPEN (Left)  Anesthesia type: General  Patient location: PACU  Post pain: Pain level controlled  Post assessment: Post-op Vital signs reviewed  Last Vitals:  Filed Vitals:   01/30/12 1515  BP: 140/83  Pulse: 57  Temp:   Resp: 12    Post vital signs: Reviewed  Level of consciousness: sedated  Complications: No apparent anesthesia complications

## 2012-01-30 NOTE — Preoperative (Signed)
Beta Blockers   Reason not to administer Beta Blockers:Not Applicable 

## 2012-01-30 NOTE — Brief Op Note (Signed)
01/30/2012  2:16 PM  PATIENT:  XERXES AGRUSA  66 y.o. male  PRE-OPERATIVE DIAGNOSIS:  left shoulder rotator cuff tear   POST-OPERATIVE DIAGNOSIS:  A-C joint Arthropathy;SEVERE Impingement Left Shoulder.  PROCEDURE:  Procedure(s) (LRB) with comments: ROTATOR CUFF REPAIR SHOULDER OPEN (Left) - Left Shoulder open Rotator Cuff with acrominectomy and distal clavicle resection. No Rotator Cuff Repair was Needed.  SURGEON:  Surgeon(s) and Role:    * Jacki Cones, MD - Primary  PHYSICIAN ASSISTANT: Dimitri Ped PA  ASSISTANTS: Dimitri Ped PA   ANESTHESIA:   general  EBL:  Total I/O In: 1000 [I.V.:1000] Out: 25 [Blood:25]  BLOOD ADMINISTERED:none  DRAINS: none   LOCAL MEDICATIONS USED:  BUPIVICAINE 20cc  SPECIMEN:  No Specimen  DISPOSITION OF SPECIMEN:  N/A  COUNTS:  YES  TOURNIQUET:  * No tourniquets in log *  DICTATION: .Other Dictation: Dictation Number [id number]  PLAN OF CARE: Admit for overnight observation  PATIENT DISPOSITION:  Stable in OR   Delay start of Pharmacological VTE agent (>24hrs) due to surgical blood loss or risk of bleeding: yes

## 2012-01-30 NOTE — Progress Notes (Signed)
Notified Dr. Rica Mast of pt's CBG of 300.  Informed Dr. Rica Mast he took Lantus 36untis  insulin yesterday at 2200 and he removed his insulin pump immediately before lantus administration.  New orders given-- see MAR.

## 2012-01-30 NOTE — Progress Notes (Signed)
Inpatient Diabetes Program Recommendations  AACE/ADA: New Consensus Statement on Inpatient Glycemic Control (2013)  Target Ranges:  Prepandial:   less than 140 mg/dL      Peak postprandial:   less than 180 mg/dL (1-2 hours)      Critically ill patients:  140 - 180 mg/dL   Reason for Visit: Hyperglycemia pre-op in patient that usually uses an insulin pump to manage diabetes.  Note:  Patient scheduled for shoulder surgery today.  Per patient, his endocrinologist from [facility], Dr. Jeronimo Norma, sent a letter electronically to Dr. Darrelyn Hillock outlining recommendations for subcutaneous insulin injections while off the insulin pump.  Patient was advised by Dr. Everette Rank to stop his insulin pump last night and take Lantus 36 units.  This morning at home, CBG's reportedly 200 to 220 mg/dl.  Upon arrival on Short Stay, CBG was 300 mg/dl.  Received Novolog 11 units at 10:40 per resistant scale.  Should glucose level not respond in a timely way to subcutaneous insulin, please consider the GlucoStabilizer for IV insulin-- even though his surgery is estimated to last only 1 1/2 hours.  Patient's insulin pump was left at home because Dr. Everette Rank plans for him to resume pump after he returns home.  At first patient states he doesn't remember pump settings-- but later states he receives a flat rate of Novolog 1.5 units hourly for a total of 36 units Novolog every 24 hours as basal insulin.  (Per PA note, patient's rate is 1.8 units/hour.)  Theoretically the dose of Lantus 36 units should have been adequate, but sometimes it takes more than one dose to see full effect of Lantus.  Will need at least Lantus 36 units at HS nightly until discharged-- or dosage as recommended per Dr. Everette Rank.  Patient states that he usually takes 1 unit Novolog to cover every 6 GM CHO.  CHO Modified Medium diet provides 60 GM CHO per meal, so 10 units of meal coverage tid with meals would be home ratio.  Would recommend Novolog 10 units tid  with meals as meal coverage, or dosage recommended per Dr. Everette Rank.   Patient doesn't remember the correction factor set in his bolus calculator, but sounds as though he has insulin resistance, even though he is type 1.  Would recommend resistant or moderate scale, or correction recommended by Dr. Everette Rank.  Thank you.  Bassem Bernasconi S. Elsie Lincoln, RN, CNS, CDE Inpatient Diabetes Program, team pager [phone]

## 2012-01-31 MED ORDER — METHOCARBAMOL 500 MG PO TABS: 500.0000 mg | ORAL_TABLET | Freq: Four times a day (QID) | ORAL | Status: DC | PRN

## 2012-01-31 MED ORDER — OXYCODONE-ACETAMINOPHEN 5-325 MG PO TABS: 1.0000 | ORAL_TABLET | ORAL | Status: DC | PRN

## 2012-01-31 NOTE — Discharge Summary (Signed)
Physician Discharge Summary  Patient ID: Stephen Beasley MRN: [id number] DOB/AGE: Mar 10, 1945 67 y.o.  Admit date: 01/30/2012 Discharge date: 01/31/2012  Admission Diagnoses:AC Joint Arthropathy and SEVERE Impingement of Left Shoulder.  Discharge Diagnoses:  Active Problems:  Impingement syndrome of left shoulder  AC joint arthropathy   Discharged Condition: Improved  Hospital Course: No Post-Op problems.  Consults: OT  Significant Diagnostic Studies: None  Treatments:None  Discharge Exam: Blood pressure 124/82, pulse 76, temperature 97.9 F (36.6 C), temperature source Oral, resp. rate 16, height 6\' 4"  (1.93 m), weight 112.038 kg (247 lb), SpO2 95.00%. Extremities: Good function in hand and wound looks fine.  Disposition:   Discharge Orders    Future Orders Please Complete By Expires   Diet - low sodium heart healthy      Call MD / Call 911      Comments:   If you experience chest pain or shortness of breath, CALL 911 and be transported to the hospital emergency room.  If you develope a fever above 101 F, pus (white drainage) or increased drainage or redness at the wound, or calf pain, call your surgeon's office.   Constipation Prevention      Comments:   Drink plenty of fluids.  Prune juice may be helpful.  You may use a stool softener, such as Colace (over the counter) 100 mg twice a day.  Use MiraLax (over the counter) for constipation as needed.   Increase activity slowly as tolerated      Discharge instructions      Comments:   Keep your sling on at all times, including sleeping in your sling.  The only time you should remove your sling is to shower only but you need to keep your hand against your chest while you shower.   For the first few days, remove your dressing, tape a piece of saran wrap over your incision, take your shower, then remove the saran wrap and put a clean dressing on, then reapply your sling.  After two days you can shower without the saran wrap.     Call Dr. Darrelyn Hillock if any wound complications or temperature of 101 degrees F or over.   Call the office for an appointment to see Dr. Darrelyn Hillock in two weeks: [phone] and ask for Dr. Jeannetta Ellis nurse, [facility] [place].   Driving restrictions      Comments:   No driving for two weeks       Medication List     As of 01/31/2012  8:50 AM    STOP taking these medications         acetaminophen 500 MG tablet   Commonly known as: TYLENOL      TAKE these medications         aspirin 325 MG tablet   Take 325 mg by mouth daily.      clonazePAM 1 MG tablet   Commonly known as: KLONOPIN   Take 1 mg by mouth 2 (two) times daily as needed. For anxiety      esomeprazole 40 MG capsule   Commonly known as: NEXIUM   Take 40 mg by mouth daily before breakfast.      Fluticasone-Salmeterol 250-50 MCG/DOSE Aepb   Commonly known as: ADVAIR   Inhale 1 puff into the lungs daily.      insulin aspart 100 UNIT/ML injection   Commonly known as: novoLOG   Inject into the skin See admin instructions. For insulin pump. Rate of 1.8 units per hour. Sugar  is below 60, stop insulin. Use sliding scale around meals depending on carb intake. Varies between 3 units and 19 units.      insulin glargine 100 UNIT/ML injection   Commonly known as: LANTUS   Inject 36 Units into the skin once.      levothyroxine 137 MCG tablet   Commonly known as: SYNTHROID, LEVOTHROID   Take 137 mcg by mouth every morning.      methocarbamol 500 MG tablet   Commonly known as: ROBAXIN   Take 1 tablet (500 mg total) by mouth every 6 (six) hours as needed.      mometasone 50 MCG/ACT nasal spray   Commonly known as: NASONEX   Place 2 sprays into the nose daily.      multivitamin with minerals Tabs   Take 1 tablet by mouth daily.      oxyCODONE-acetaminophen 5-325 MG per tablet   Commonly known as: PERCOCET/ROXICET   Take 1-2 tablets by mouth every 4 (four) hours as needed.      ramipril 10 MG capsule   Commonly known as:  ALTACE   Take 20 mg by mouth daily.      simvastatin 20 MG tablet   Commonly known as: ZOCOR   Take 20 mg by mouth every morning.      sotalol 80 MG tablet   Commonly known as: BETAPACE   Take 80 mg by mouth 2 (two) times daily.         Signed: Lavante Toso A 01/31/2012, 8:50 AM  Rule out Rotator Cuff Tear and SEVERE Impingement Syndrome of Left Shoulder.

## 2012-01-31 NOTE — Op Note (Signed)
NAMETYRANN, DONAHO                 ACCOUNT NO.:  [id number]  MEDICAL RECORD NO.:  [id number]  LOCATION:  1614                         FACILITY:  [facility]  PHYSICIAN:  Georges Lynch. Nilah Belcourt, M.D.DATE OF BIRTH:  December 02, 1945  DATE OF PROCEDURE:  01/30/2012 DATE OF DISCHARGE:                              OPERATIVE REPORT   SURGEON:  Georges Lynch. Darrelyn Hillock, M.D.  ASSISTANT:  Dimitri Ped, [place]  PREOPERATIVE DIAGNOSES: 1. Rotator cuff tendon tear of the left shoulder. 2. Acromioclavicular joint arthritis. 3. Severe impingement, left shoulder.  POSTOPERATIVE DIAGNOSES: 1. Severe impingement syndrome, left shoulder. 2. Severe acromioclavicular joint arthritis.  The rotator cuff was     intact and no repair was necessary.  PROCEDURE:  Under general anesthesia with the patient in the beach-chair position, a routine orthopedic prepping and draping of the left shoulder was carried out.  He had 900 mg of clindamycin IV.  Appropriate time-out was carried out prior to making an incision.  Also, I marked the appropriate left arm in the holding area.  An incision then was made over the anterior aspect of the [facility] joint.  Bleeders were identified and cauterized.  I then separated the deltoid tendon by sharp dissection.  I then went down and identified the [facility] joint, which was severely arthritic.  I cleaned the joint out first utilizing a rongeur and then resected about 1 cm of the distal clavicle in an oblique fashion.  Once the joint was cleared, I reinspected the subacromial space and he had severe impingement of the acromion.  The acromial was markedly thickened and down slope and literally imbedded into the cup, but the cup was slightly abraded, but not severely torn where repair was necessary. Following that, I then protected the underlying cuff with a Bennett retractor.  I utilized the oscillating saw and the burr to do an acromionectomy and acromioplasty.  I thoroughly irrigated out the area. I bone  waxed the undersurface of the clavicle resection, then inserted some thrombin-soaked Gelfoam and closed the wound layers in usual fashion.  I approximated the deltoid tendon and muscle proximally with #1 Ethibond suture and the deltoid muscle was closed with #1 Vicryl. Subcu was closed with a Monocryl subcu suture.  Sterile dressings were applied.  The patient leave the operating room with a sling in place.          ______________________________ Georges Lynch. Darrelyn Hillock, M.D.    RAG/MEDQ  D:  01/30/2012  T:  01/31/2012  Job:  [id number]

## 2012-01-31 NOTE — Evaluation (Signed)
Occupational Therapy Evaluation Patient Details Name: Stephen Beasley MRN: [id number] DOB: 10-09-1945 Today's Date: 01/31/2012 Time: 1478-2956 OT Time Calculation (min): 34 min  OT Assessment / Plan / Recommendation Clinical Impression  Pt is s/p L shoulder surgery and tolerated all education well. Ready for d/c from OT standpoint.    OT Assessment  Patient does not need any further OT services    Follow Up Recommendations  No OT follow up;Supervision/Assistance - 24 hour    Barriers to Discharge      Equipment Recommendations  None recommended by OT    Recommendations for Other Services    Frequency       Precautions / Restrictions Precautions Precautions: Shoulder Precaution Booklet Issued: Yes (comment) Precaution Comments: Reviewed shoulder care handout and precautions Required Braces or Orthoses: Other Brace/Splint Other Brace/Splint: L shoulder immobilizer Restrictions Weight Bearing Restrictions: Yes LUE Weight Bearing: Non weight bearing        ADL       OT Diagnosis:    OT Problem List:   OT Treatment Interventions:     OT Goals    Visit Information  Last OT Received On: 01/31/12 Assistance Needed: +1    Subjective Data  Subjective: I have this sling on under here Patient Stated Goal: home   Prior Functioning     Home Living Lives With: Spouse Available Help at Discharge: Family Bathroom Toilet: Standard Dominant Hand: Right         Vision/Perception     Cognition       Extremity/Trunk Assessment       Mobility       Shoulder Instructions Donning/doffing shirt without moving shoulder: Caregiver independent with task Method for sponge bathing under operated UE:  (caregiver verbalizes understanding of how to assist ) Donning/doffing sling/immobilizer: Caregiver independent with task Correct positioning of sling/immobilizer: Caregiver independent with task ROM for elbow, wrist and digits of operated UE: Supervision/safety Sling  wearing schedule (on at all times/off for ADL's): Caregiver independent with task Proper positioning of operated UE when showering: Caregiver independent with task Positioning of UE while sleeping: Caregiver independent with task Pt's wife initiated assist for ADL but pt did help with dressing tasks, etc -overall mod assist.   Exercise Other Exercises Other Exercises: pt performed elbow, wrist and digit flexion and extension exercises in standing with L UE at side.   Balance     End of Session OT - End of Session Activity Tolerance: Patient tolerated treatment well Patient left: Other (comment);with family/visitor present (EOB with wife)  GO Functional Assessment Tool Used: clinical judgement Functional Limitation: Self care Self Care Current Status [phone]): At least 20 percent but less than 40 percent impaired, limited or restricted Self Care Goal Status (M5784): At least 20 percent but less than 40 percent impaired, limited or restricted Self Care Discharge Status [phone]): At least 20 percent but less than 40 percent impaired, limited or restricted   Lennox Laity [phone] 01/31/2012, 11:59 AM

## 2012-01-31 NOTE — Progress Notes (Signed)
Subjective: 1 Day Post-Op Procedure(s) (LRB): ROTATOR CUFF REPAIR SHOULDER OPEN (Left) Patient reports pain as 2 on 0-10 scale.  Dressing changed and wound looks fine.   Objective: Vital signs in last 24 hours: Temp:  [97.5 F (36.4 C)-98.5 F (36.9 C)] 97.9 F (36.6 C) (01/01 0606) Pulse Rate:  [52-76] 76  (01/01 0606) Resp:  [10-18] 16  (01/01 0606) BP: (117-147)/(64-89) 124/82 mmHg (01/01 0606) SpO2:  [90 %-100 %] 95 % (01/01 0606) Weight:  [112.038 kg (247 lb)] 112.038 kg (247 lb) (01/01 0435)  Intake/Output from previous day: 12/31 0701 - 01/01 0700 In: 3978.3 [P.O.:840; I.V.:3138.3] Out: 2575 [Urine:2550; Blood:25] Intake/Output this shift:    No results found for this basename: HGB:5 in the last 72 hours No results found for this basename: WBC:2,RBC:2,HCT:2,PLT:2 in the last 72 hours No results found for this basename: NA:2,K:2,CL:2,CO2:2,BUN:2,CREATININE:2,GLUCOSE:2,CALCIUM:2 in the last 72 hours No results found for this basename: LABPT:2,INR:2 in the last 72 hours  Neurologically intact No cellulitis present  Assessment/Plan: 1 Day Post-Op Procedure(s) (LRB): ROTATOR CUFF REPAIR SHOULDER OPEN (Left) Up with therapy. DC today after seen by OT.  Stephen Beasley A 01/31/2012, 8:11 AM

## 2012-02-01 ENCOUNTER — Encounter (HOSPITAL_COMMUNITY): Payer: Self-pay | Admitting: Orthopedic Surgery

## 2012-02-01 NOTE — Progress Notes (Signed)
Utilization review completed.  

## 2012-03-07 ENCOUNTER — Emergency Department (HOSPITAL_COMMUNITY): Payer: BC Managed Care – PPO

## 2012-03-07 ENCOUNTER — Encounter (HOSPITAL_COMMUNITY): Payer: Self-pay | Admitting: *Deleted

## 2012-03-07 ENCOUNTER — Emergency Department (HOSPITAL_COMMUNITY)
Admission: EM | Admit: 2012-03-07 | Discharge: 2012-03-07 | Disposition: A | Discharge status: Home / Self Care | Payer: BC Managed Care – PPO | Attending: Emergency Medicine | Admitting: Emergency Medicine

## 2012-03-07 DIAGNOSIS — Z7982 Long term (current) use of aspirin: Secondary | ICD-10-CM | POA: Insufficient documentation

## 2012-03-07 DIAGNOSIS — R002 Palpitations: Secondary | ICD-10-CM | POA: Insufficient documentation

## 2012-03-07 DIAGNOSIS — E1169 Type 2 diabetes mellitus with other specified complication: Secondary | ICD-10-CM | POA: Insufficient documentation

## 2012-03-07 DIAGNOSIS — R739 Hyperglycemia, unspecified: Secondary | ICD-10-CM

## 2012-03-07 DIAGNOSIS — R55 Syncope and collapse: Secondary | ICD-10-CM | POA: Insufficient documentation

## 2012-03-07 DIAGNOSIS — R079 Chest pain, unspecified: Secondary | ICD-10-CM | POA: Insufficient documentation

## 2012-03-07 DIAGNOSIS — Z8679 Personal history of other diseases of the circulatory system: Secondary | ICD-10-CM | POA: Insufficient documentation

## 2012-03-07 DIAGNOSIS — E0590 Thyrotoxicosis, unspecified without thyrotoxic crisis or storm: Secondary | ICD-10-CM | POA: Insufficient documentation

## 2012-03-07 DIAGNOSIS — Z8739 Personal history of other diseases of the musculoskeletal system and connective tissue: Secondary | ICD-10-CM | POA: Insufficient documentation

## 2012-03-07 DIAGNOSIS — Z9889 Other specified postprocedural states: Secondary | ICD-10-CM | POA: Insufficient documentation

## 2012-03-07 DIAGNOSIS — Z794 Long term (current) use of insulin: Secondary | ICD-10-CM | POA: Insufficient documentation

## 2012-03-07 DIAGNOSIS — Z87442 Personal history of urinary calculi: Secondary | ICD-10-CM | POA: Insufficient documentation

## 2012-03-07 DIAGNOSIS — K219 Gastro-esophageal reflux disease without esophagitis: Secondary | ICD-10-CM | POA: Insufficient documentation

## 2012-03-07 DIAGNOSIS — Z87891 Personal history of nicotine dependence: Secondary | ICD-10-CM | POA: Insufficient documentation

## 2012-03-07 DIAGNOSIS — Z79899 Other long term (current) drug therapy: Secondary | ICD-10-CM | POA: Insufficient documentation

## 2012-03-07 LAB — PRO B NATRIURETIC PEPTIDE: Pro B Natriuretic peptide (BNP): 427.6 pg/mL — ABNORMAL HIGH (ref 0–125)

## 2012-03-07 LAB — CBC
MCH: 32.2 pg (ref 26.0–34.0)
MCHC: 34.8 g/dL (ref 30.0–36.0)
MCV: 92.5 fL (ref 78.0–100.0)
Platelets: 185 10*3/uL (ref 150–400)

## 2012-03-07 LAB — COMPREHENSIVE METABOLIC PANEL
AST: 27 U/L (ref 0–37)
Albumin: 4 g/dL (ref 3.5–5.2)
BUN: 13 mg/dL (ref 6–23)
Calcium: 9.5 mg/dL (ref 8.4–10.5)
Chloride: 102 mEq/L (ref 96–112)
Creatinine, Ser: 0.95 mg/dL (ref 0.50–1.35)
GFR calc non Af Amer: 86 mL/min — ABNORMAL LOW (ref >90)
Total Bilirubin: 0.5 mg/dL (ref 0.3–1.2)

## 2012-03-07 LAB — POCT I-STAT TROPONIN I: Troponin i, poc: 0 ng/mL (ref 0.00–0.08)

## 2012-03-07 LAB — D-DIMER, QUANTITATIVE: D-Dimer, Quant: 0.29 ug/mL-FEU (ref 0.00–0.48)

## 2012-03-07 NOTE — ED Provider Notes (Signed)
History     CSN: [id number]  Arrival date & time 03/07/12  1652   First MD Initiated Contact with Patient 03/07/12 1709      Chief Complaint  Patient presents with  . Chest Pain    (Consider location/radiation/quality/duration/timing/severity/associated sxs/prior treatment) HPI Comments: 67 year old male with a history of diabetes, hypertension, nonspecific super ventricular tachycardia who presents with a complaint of acute onset of chest pain and palpitations and near syncope which occurred in the last 2 hours. The symptoms were acute in onset, persistent, gradually improved and currently has a chest pressure rated at 5/10.  He denies coughing, fever, swelling, recent trauma, oral use, smoking but he did have recent shoulder surgery approximately 5-6 weeks ago.  Patient is a 67 y.o. male presenting with chest pain. The history is provided by the patient and the spouse.  Chest Pain     Past Medical History  Diagnosis Date  . Hypertension   . Dysrhythmia     Premature PVC's, and skipped beats- Dr. Arvilla Market follows -[facility]  . Multiple allergies 01-26-12    Advair for allergies  . GERD (gastroesophageal reflux disease)   . Arthritis     mild hands, shoulders  . Diabetes mellitus without complication     Insulin Pump (Medtronic Paradigm) being used  . Kidney stone     40 yrs ago  . Hyperthyroidism     Past Surgical History  Procedure Date  . Cardiac catheterization     6 yrs ago -negative for blockages  . Tonsillectomy   . Fracture surgery     Right ankle retained hardware  . Appendectomy   . Hernia repair     RIH x1, Umbilical X2  . Knee arthroscopy     right knee  . Cervical fusion     no retained hardware  . Shoulder open rotator cuff repair 01/30/2012    Procedure: ROTATOR CUFF REPAIR SHOULDER OPEN;  Surgeon: Jacki Cones, MD;  Location: [facility];  Service: Orthopedics;  Laterality: Left;  Left Shoulder open Rotator Cuff with acrominectomy and distal  clavicle resection.     No family history on file.  History  Substance Use Topics  . Smoking status: Former Smoker -- 0.5 packs/day    Types: Cigarettes    Quit date: 01/25/1982  . Smokeless tobacco: Not on file  . Alcohol Use: 0.0 oz/week    1-2 Cans of beer per week     Comment: occ.      Review of Systems  Cardiovascular: Positive for chest pain.  All other systems reviewed and are negative.    Allergies  Augmentin; Chocolate; and Lipitor  Home Medications   Current Outpatient Rx  Name  Route  Sig  Dispense  Refill  . ASPIRIN 325 MG PO TABS   Oral   Take 325 mg by mouth daily.         Marland Kitchen CLONAZEPAM 1 MG PO TABS   Oral   Take 1 mg by mouth 2 (two) times daily. For anxiety         . CYANOCOBALAMIN 1000 MCG/ML IJ SOLN   Intramuscular   Inject 1,000 mcg into the muscle every 30 (thirty) days. Date every month varies - daughter administers         . ESOMEPRAZOLE MAGNESIUM 40 MG PO CPDR   Oral   Take 40 mg by mouth every evening.          Marland Kitchen FLUTICASONE-SALMETEROL 250-50 MCG/DOSE IN AEPB  Inhalation   Inhale 1 puff into the lungs daily.         . INSULIN PUMP   Subcutaneous   Inject into the skin continuous. Novolog:  1.5 units/hour         . LEVOTHYROXINE SODIUM 137 MCG PO TABS   Oral   Take 137 mcg by mouth every morning.         Marland Kitchen METHOCARBAMOL 500 MG PO TABS   Oral   Take 500 mg by mouth daily as needed. For muscle spasms         . MOMETASONE FUROATE 50 MCG/ACT NA SUSP   Nasal   Place 2 sprays into the nose daily.         . ADULT MULTIVITAMIN W/MINERALS CH   Oral   Take 1 tablet by mouth every evening.          . OXYCODONE-ACETAMINOPHEN 5-325 MG PO TABS   Oral   Take 1 tablet by mouth daily as needed. For pain         . RAMIPRIL 10 MG PO CAPS   Oral   Take 20 mg by mouth daily.         Marland Kitchen SIMVASTATIN 20 MG PO TABS   Oral   Take 20 mg by mouth every morning.         Marland Kitchen SOTALOL HCL 80 MG PO TABS   Oral   Take  80 mg by mouth 2 (two) times daily.           BP 131/74  Pulse 90  Temp 97.7 F (36.5 C) (Oral)  Resp 16  Ht 6\' 4"  (1.93 m)  Wt 248 lb (112.492 kg)  BMI 30.19 kg/m2  SpO2 95%  Physical Exam  Nursing note and vitals reviewed. Constitutional: He appears well-developed and well-nourished. No distress.  HENT:  Head: Normocephalic and atraumatic.  Mouth/Throat: Oropharynx is clear and moist. No oropharyngeal exudate.  Eyes: Conjunctivae normal and EOM are normal. Pupils are equal, round, and reactive to light. Right eye exhibits no discharge. Left eye exhibits no discharge. No scleral icterus.  Neck: Normal range of motion. Neck supple. No JVD present. No thyromegaly present.  Cardiovascular: Normal rate, regular rhythm, normal heart sounds and intact distal pulses.  Exam reveals no gallop and no friction rub.   No murmur heard. Pulmonary/Chest: Effort normal and breath sounds normal. No respiratory distress. He has no wheezes. He has no rales.  Abdominal: Soft. Bowel sounds are normal. He exhibits no distension and no mass. There is no tenderness.  Musculoskeletal: Normal range of motion. He exhibits no edema and no tenderness.  Lymphadenopathy:    He has no cervical adenopathy.  Neurological: He is alert. Coordination normal.  Skin: Skin is warm and dry. No rash noted. No erythema.  Psychiatric: He has a normal mood and affect. His behavior is normal.    ED Course  Procedures (including critical care time)  Labs Reviewed  PRO B NATRIURETIC PEPTIDE - Abnormal; Notable for the following:    Pro B Natriuretic peptide (BNP) 427.6 (*)     All other components within normal limits  COMPREHENSIVE METABOLIC PANEL - Abnormal; Notable for the following:    Glucose, Bld 372 (*)     GFR calc non Af Amer 86 (*)     All other components within normal limits  GLUCOSE, CAPILLARY - Abnormal; Notable for the following:    Glucose-Capillary 330 (*)     All other components within  normal  limits  CBC  POCT I-STAT TROPONIN I  D-DIMER, QUANTITATIVE   Dg Chest 2 View  03/07/2012  *RADIOLOGY REPORT*  Clinical Data: Chest pain, shortness of breath  CHEST - 2 VIEW  Comparison: 01/26/2012  Findings: Grossly unchanged cardiac silhouette and mediastinal contours with mild tortuosity of the thoracic aorta.  No focal airspace opacities.  No pleural effusion or pneumothorax. Unchanged bones.  IMPRESSION: No acute cardiopulmonary disease.   Original Report Authenticated By: Tacey Ruiz, MD      1. Chest pain   2. Hyperglycemia       MDM  At this time the patient has a normal EKG, he is not tachycardic nor is he hypoxic and has normal heart and lung sounds. There is no peripheral edema. He reports that he had a heart catheterization 5 years ago which was totally normal and a nuclear stress test 6 months ago which was normal as well. We'll proceed with d-dimer as his troponin is normal, labs are normal and chest x-ray.  PA and lateral views of the chest were obtained by digital radiography. I have personally interpreted these x-rays and find her to be no signs of pulmonary infiltrate, cardiomegaly, subdiaphragmatic free air, soft tissue abnormality, no obvious bony abnormalities or fractures.   ED ECG REPORT  I personally interpreted this EKG   Date: 03/07/2012   Rate: 90  Rhythm: normal sinus rhythm and premature ventricular contractions (PVC)  QRS Axis: normal  Intervals: normal  ST/T Wave abnormalities: normal  Conduction Disutrbances:none  Narrative Interpretation:   Old EKG Reviewed: Compared with 01/26/2012, no significant changes are seen   Patient reexamined, his rate comfortable though tired, no chest pain, no arrhythmias at this time and his EKG showed no arrhythmias either. Laboratory workup negative including d-dimer, patient stable for discharge as he has had a recent very thorough cardiac workup.      Vida Roller, MD 03/07/12 [phone]

## 2012-03-07 NOTE — ED Notes (Addendum)
Pt with hx of "arrhythmias" to ED c/o chest pain and HR of 135 at home.  PT also c/o of fatigue, nausea and sob.  Took 1 325 mg asa before coming to ED.  Pt appears "sleepy".

## 2012-03-07 NOTE — ED Notes (Addendum)
Pt gave self 5 units of insulin through insulin pump. CBG 330

## 2012-03-07 NOTE — ED Notes (Signed)
Pt given discharge paperwork; pt verbalized understanding of discharge and f/u; no additional questions by pt; e-signature obtained; VSS; 

## 2015-04-14 ENCOUNTER — Inpatient Hospital Stay (HOSPITAL_COMMUNITY)
Admission: EM | Admit: 2015-04-14 | Discharge: 2015-04-15 | DRG: 638 | Disposition: A | Discharge status: Home / Self Care | Payer: Medicare Other | Attending: Internal Medicine | Admitting: Internal Medicine

## 2015-04-14 ENCOUNTER — Other Ambulatory Visit (HOSPITAL_COMMUNITY): Payer: PRIVATE HEALTH INSURANCE

## 2015-04-14 ENCOUNTER — Encounter (HOSPITAL_COMMUNITY): Payer: Self-pay | Admitting: Emergency Medicine

## 2015-04-14 ENCOUNTER — Emergency Department (HOSPITAL_COMMUNITY): Payer: Medicare Other

## 2015-04-14 DIAGNOSIS — E875 Hyperkalemia: Secondary | ICD-10-CM | POA: Diagnosis present

## 2015-04-14 DIAGNOSIS — E1010 Type 1 diabetes mellitus with ketoacidosis without coma: Secondary | ICD-10-CM | POA: Diagnosis not present

## 2015-04-14 DIAGNOSIS — E785 Hyperlipidemia, unspecified: Secondary | ICD-10-CM | POA: Diagnosis present

## 2015-04-14 DIAGNOSIS — N179 Acute kidney failure, unspecified: Secondary | ICD-10-CM | POA: Diagnosis not present

## 2015-04-14 DIAGNOSIS — Z9641 Presence of insulin pump (external) (internal): Secondary | ICD-10-CM | POA: Diagnosis present

## 2015-04-14 DIAGNOSIS — Z7982 Long term (current) use of aspirin: Secondary | ICD-10-CM

## 2015-04-14 DIAGNOSIS — Z7951 Long term (current) use of inhaled steroids: Secondary | ICD-10-CM

## 2015-04-14 DIAGNOSIS — R197 Diarrhea, unspecified: Secondary | ICD-10-CM | POA: Diagnosis present

## 2015-04-14 DIAGNOSIS — Z91018 Allergy to other foods: Secondary | ICD-10-CM | POA: Diagnosis not present

## 2015-04-14 DIAGNOSIS — Z794 Long term (current) use of insulin: Secondary | ICD-10-CM | POA: Diagnosis not present

## 2015-04-14 DIAGNOSIS — Z79899 Other long term (current) drug therapy: Secondary | ICD-10-CM

## 2015-04-14 DIAGNOSIS — I10 Essential (primary) hypertension: Secondary | ICD-10-CM | POA: Diagnosis present

## 2015-04-14 DIAGNOSIS — Z888 Allergy status to other drugs, medicaments and biological substances status: Secondary | ICD-10-CM

## 2015-04-14 DIAGNOSIS — Z981 Arthrodesis status: Secondary | ICD-10-CM

## 2015-04-14 DIAGNOSIS — E039 Hypothyroidism, unspecified: Secondary | ICD-10-CM | POA: Diagnosis present

## 2015-04-14 DIAGNOSIS — Z87891 Personal history of nicotine dependence: Secondary | ICD-10-CM

## 2015-04-14 DIAGNOSIS — M19019 Primary osteoarthritis, unspecified shoulder: Secondary | ICD-10-CM

## 2015-04-14 DIAGNOSIS — R072 Precordial pain: Secondary | ICD-10-CM | POA: Diagnosis not present

## 2015-04-14 DIAGNOSIS — R079 Chest pain, unspecified: Secondary | ICD-10-CM | POA: Diagnosis not present

## 2015-04-14 DIAGNOSIS — E1101 Type 2 diabetes mellitus with hyperosmolarity with coma: Secondary | ICD-10-CM

## 2015-04-14 DIAGNOSIS — M7542 Impingement syndrome of left shoulder: Secondary | ICD-10-CM

## 2015-04-14 DIAGNOSIS — R739 Hyperglycemia, unspecified: Secondary | ICD-10-CM | POA: Diagnosis present

## 2015-04-14 DIAGNOSIS — I209 Angina pectoris, unspecified: Secondary | ICD-10-CM | POA: Diagnosis present

## 2015-04-14 DIAGNOSIS — K219 Gastro-esophageal reflux disease without esophagitis: Secondary | ICD-10-CM | POA: Diagnosis present

## 2015-04-14 DIAGNOSIS — Z881 Allergy status to other antibiotic agents status: Secondary | ICD-10-CM | POA: Diagnosis not present

## 2015-04-14 DIAGNOSIS — E1110 Type 2 diabetes mellitus with ketoacidosis without coma: Secondary | ICD-10-CM | POA: Diagnosis present

## 2015-04-14 LAB — BASIC METABOLIC PANEL
ANION GAP: 9 (ref 5–15)
Anion gap: 15 (ref 5–15)
Anion gap: 9 (ref 5–15)
Anion gap: 10 (ref 5–15)
Anion gap: 9 (ref 5–15)
Anion gap: 9 (ref 5–15)
BUN: 26 mg/dL — ABNORMAL HIGH (ref 6–20)
BUN: 25 mg/dL — ABNORMAL HIGH (ref 6–20)
BUN: 24 mg/dL — ABNORMAL HIGH (ref 6–20)
BUN: 21 mg/dL — ABNORMAL HIGH (ref 6–20)
BUN: 22 mg/dL — ABNORMAL HIGH (ref 6–20)
BUN: 21 mg/dL — ABNORMAL HIGH (ref 6–20)
CALCIUM: 9.2 mg/dL (ref 8.9–10.3)
CALCIUM: 9.3 mg/dL (ref 8.9–10.3)
CALCIUM: 8.5 mg/dL — AB (ref 8.9–10.3)
CALCIUM: 8.9 mg/dL (ref 8.9–10.3)
CALCIUM: 8.8 mg/dL — AB (ref 8.9–10.3)
CHLORIDE: 100 mmol/L — AB (ref 101–111)
CO2: 19 mmol/L — AB (ref 22–32)
CO2: 20 mmol/L — ABNORMAL LOW (ref 22–32)
CO2: 22 mmol/L (ref 22–32)
CO2: 20 mmol/L — AB (ref 22–32)
CO2: 21 mmol/L — AB (ref 22–32)
CO2: 22 mmol/L (ref 22–32)
CREATININE: 1.44 mg/dL — AB (ref 0.61–1.24)
CREATININE: 1.14 mg/dL (ref 0.61–1.24)
CREATININE: 1.04 mg/dL (ref 0.61–1.24)
CREATININE: 1.08 mg/dL (ref 0.61–1.24)
CREATININE: 1.08 mg/dL (ref 0.61–1.24)
Calcium: 9.2 mg/dL (ref 8.9–10.3)
Chloride: 109 mmol/L (ref 101–111)
Chloride: 110 mmol/L (ref 101–111)
Chloride: 107 mmol/L (ref 101–111)
Chloride: 111 mmol/L (ref 101–111)
Chloride: 110 mmol/L (ref 101–111)
Creatinine, Ser: 1.17 mg/dL (ref 0.61–1.24)
GFR calc non Af Amer: 48 mL/min — ABNORMAL LOW (ref >60)
GFR calc non Af Amer: >60 mL/min (ref >60)
GFR calc non Af Amer: >60 mL/min (ref >60)
GFR calc non Af Amer: >60 mL/min (ref >60)
GFR calc non Af Amer: >60 mL/min (ref >60)
GFR, EST AFRICAN AMERICAN: 56 mL/min — AB (ref >60)
GLUCOSE: 337 mg/dL — AB (ref 65–99)
GLUCOSE: 138 mg/dL — AB (ref 65–99)
Glucose, Bld: 694 mg/dL (ref 65–99)
Glucose, Bld: 204 mg/dL — ABNORMAL HIGH (ref 65–99)
Glucose, Bld: 419 mg/dL — ABNORMAL HIGH (ref 65–99)
Glucose, Bld: 110 mg/dL — ABNORMAL HIGH (ref 65–99)
POTASSIUM: 4 mmol/L (ref 3.5–5.1)
Potassium: 5.4 mmol/L — ABNORMAL HIGH (ref 3.5–5.1)
Potassium: 3.8 mmol/L (ref 3.5–5.1)
Potassium: 3.6 mmol/L (ref 3.5–5.1)
Potassium: 3.8 mmol/L (ref 3.5–5.1)
Potassium: 3.7 mmol/L (ref 3.5–5.1)
SODIUM: 138 mmol/L (ref 135–145)
SODIUM: 141 mmol/L (ref 135–145)
SODIUM: 137 mmol/L (ref 135–145)
Sodium: 134 mmol/L — ABNORMAL LOW (ref 135–145)
Sodium: 141 mmol/L (ref 135–145)
Sodium: 141 mmol/L (ref 135–145)

## 2015-04-14 LAB — I-STAT CHEM 8, ED
BUN: 28 mg/dL — AB (ref 6–20)
CALCIUM ION: 1.12 mmol/L — AB (ref 1.13–1.30)
CHLORIDE: 101 mmol/L (ref 101–111)
CREATININE: 1 mg/dL (ref 0.61–1.24)
GLUCOSE: 666 mg/dL — AB (ref 65–99)
HEMATOCRIT: 41 % (ref 39.0–52.0)
HEMOGLOBIN: 13.9 g/dL (ref 13.0–17.0)
Potassium: 5.1 mmol/L (ref 3.5–5.1)
SODIUM: 134 mmol/L — AB (ref 135–145)
TCO2: 20 mmol/L (ref 0–100)

## 2015-04-14 LAB — CBC WITH DIFFERENTIAL/PLATELET
BASOS ABS: 0 10*3/uL (ref 0.0–0.1)
BASOS PCT: 0 %
EOS ABS: 0.1 10*3/uL (ref 0.0–0.7)
Eosinophils Relative: 1 %
HEMATOCRIT: 37.7 % — AB (ref 39.0–52.0)
HEMOGLOBIN: 12.4 g/dL — AB (ref 13.0–17.0)
Lymphocytes Relative: 8 %
Lymphs Abs: 0.8 10*3/uL (ref 0.7–4.0)
MCH: 30.5 pg (ref 26.0–34.0)
MCHC: 32.9 g/dL (ref 30.0–36.0)
MCV: 92.6 fL (ref 78.0–100.0)
Monocytes Absolute: 0.7 10*3/uL (ref 0.1–1.0)
Monocytes Relative: 7 %
NEUTROS ABS: 8.8 10*3/uL — AB (ref 1.7–7.7)
NEUTROS PCT: 84 %
Platelets: 183 10*3/uL (ref 150–400)
RBC: 4.07 MIL/uL — ABNORMAL LOW (ref 4.22–5.81)
RDW: 12.5 % (ref 11.5–15.5)
WBC: 10.5 10*3/uL (ref 4.0–10.5)

## 2015-04-14 LAB — URINALYSIS, ROUTINE W REFLEX MICROSCOPIC
Bilirubin Urine: NEGATIVE
Glucose, UA: >1000 mg/dL — AB
Hgb urine dipstick: NEGATIVE
Ketones, ur: 40 mg/dL — AB
Leukocytes, UA: NEGATIVE
NITRITE: NEGATIVE
Protein, ur: NEGATIVE mg/dL
SPECIFIC GRAVITY, URINE: 1.025 (ref 1.005–1.030)
pH: 6 (ref 5.0–8.0)

## 2015-04-14 LAB — CBG MONITORING, ED
GLUCOSE-CAPILLARY: 329 mg/dL — AB (ref 65–99)
GLUCOSE-CAPILLARY: 281 mg/dL — AB (ref 65–99)
GLUCOSE-CAPILLARY: 116 mg/dL — AB (ref 65–99)
GLUCOSE-CAPILLARY: 120 mg/dL — AB (ref 65–99)
Glucose-Capillary: 383 mg/dL — ABNORMAL HIGH (ref 65–99)
Glucose-Capillary: 247 mg/dL — ABNORMAL HIGH (ref 65–99)
Glucose-Capillary: 208 mg/dL — ABNORMAL HIGH (ref 65–99)
Glucose-Capillary: 137 mg/dL — ABNORMAL HIGH (ref 65–99)
Glucose-Capillary: 111 mg/dL — ABNORMAL HIGH (ref 65–99)
Glucose-Capillary: 116 mg/dL — ABNORMAL HIGH (ref 65–99)
Glucose-Capillary: 93 mg/dL (ref 65–99)

## 2015-04-14 LAB — TROPONIN I: Troponin I: <0.03 ng/mL (ref <0.031)

## 2015-04-14 LAB — GLUCOSE, CAPILLARY: GLUCOSE-CAPILLARY: 111 mg/dL — AB (ref 65–99)

## 2015-04-14 LAB — URINE MICROSCOPIC-ADD ON

## 2015-04-14 LAB — I-STAT TROPONIN, ED: Troponin i, poc: 0.01 ng/mL (ref 0.00–0.08)

## 2015-04-14 LAB — MAGNESIUM: Magnesium: 2.1 mg/dL (ref 1.7–2.4)

## 2015-04-14 MED ORDER — ADULT MULTIVITAMIN W/MINERALS CH: 1.0000 | ORAL_TABLET | Freq: Every evening | ORAL | Status: DC

## 2015-04-14 MED ORDER — CLONAZEPAM 1 MG PO TABS
1.0000 mg | ORAL_TABLET | Freq: Two times a day (BID) | ORAL | Status: DC
  Administered 2015-04-14 – 2015-04-15 (×3): 1 mg via ORAL
  Filled 2015-04-14: qty 1
  Filled 2015-04-14 (×2): qty 2
  Filled 2015-04-14: qty 1

## 2015-04-14 MED ORDER — MORPHINE SULFATE (PF) 2 MG/ML IV SOLN
2.0000 mg | INTRAVENOUS | Status: DC | PRN
Start: 2015-04-14 — End: 2015-04-15

## 2015-04-14 MED ORDER — SODIUM CHLORIDE 0.9 % IV SOLN
INTRAVENOUS | Status: DC
  Administered 2015-04-14: 06:00:00 via INTRAVENOUS

## 2015-04-14 MED ORDER — SODIUM CHLORIDE 0.9 % IV SOLN
INTRAVENOUS | Status: DC
  Administered 2015-04-14 – 2015-04-15 (×2): via INTRAVENOUS

## 2015-04-14 MED ORDER — FLUTICASONE FUROATE-VILANTEROL 200-25 MCG/INH IN AEPB
1.0000 | INHALATION_SPRAY | Freq: Every day | RESPIRATORY_TRACT | Status: DC
  Administered 2015-04-15: 1 via RESPIRATORY_TRACT
  Filled 2015-04-14: qty 28

## 2015-04-14 MED ORDER — GI COCKTAIL ~~LOC~~: 30.0000 mL | Freq: Four times a day (QID) | ORAL | Status: DC | PRN

## 2015-04-14 MED ORDER — OXYCODONE-ACETAMINOPHEN 7.5-325 MG PO TABS
1.0000 | ORAL_TABLET | Freq: Four times a day (QID) | ORAL | Status: DC | PRN
  Filled 2015-04-14: qty 1

## 2015-04-14 MED ORDER — LEVOTHYROXINE SODIUM 25 MCG PO TABS
137.0000 ug | ORAL_TABLET | Freq: Every day | ORAL | Status: DC
  Administered 2015-04-14 – 2015-04-15 (×2): 137 ug via ORAL
  Filled 2015-04-14 (×3): qty 1

## 2015-04-14 MED ORDER — INSULIN REGULAR BOLUS VIA INFUSION
0.0000 [IU] | Freq: Three times a day (TID) | INTRAVENOUS | Status: DC
  Filled 2015-04-14: qty 10

## 2015-04-14 MED ORDER — AMLODIPINE BESYLATE 5 MG PO TABS
2.5000 mg | ORAL_TABLET | Freq: Every day | ORAL | Status: DC
  Filled 2015-04-14: qty 1

## 2015-04-14 MED ORDER — NITROGLYCERIN 2 % TD OINT
0.5000 [in_us] | TOPICAL_OINTMENT | TRANSDERMAL | Status: AC
  Administered 2015-04-14: 0.5 [in_us] via TOPICAL

## 2015-04-14 MED ORDER — RAMIPRIL 2.5 MG PO CAPS
20.0000 mg | ORAL_CAPSULE | Freq: Every day | ORAL | Status: DC
  Administered 2015-04-14 – 2015-04-15 (×2): 20 mg via ORAL
  Filled 2015-04-14: qty 2
  Filled 2015-04-14: qty 8

## 2015-04-14 MED ORDER — INSULIN PUMP
Freq: Three times a day (TID) | SUBCUTANEOUS | Status: DC
  Administered 2015-04-14 – 2015-04-15 (×4): via SUBCUTANEOUS
  Filled 2015-04-14: qty 1

## 2015-04-14 MED ORDER — DEXTROSE-NACL 5-0.45 % IV SOLN
INTRAVENOUS | Status: DC
  Administered 2015-04-14: 12:00:00 via INTRAVENOUS

## 2015-04-14 MED ORDER — PANTOPRAZOLE SODIUM 40 MG PO TBEC: 80.0000 mg | DELAYED_RELEASE_TABLET | Freq: Every evening | ORAL | Status: DC

## 2015-04-14 MED ORDER — SODIUM CHLORIDE 0.9% FLUSH
3.0000 mL | Freq: Two times a day (BID) | INTRAVENOUS | Status: DC
Start: 2015-04-14 — End: 2015-04-15
  Administered 2015-04-15: 3 mL via INTRAVENOUS

## 2015-04-14 MED ORDER — SIMVASTATIN 20 MG PO TABS
20.0000 mg | ORAL_TABLET | Freq: Every morning | ORAL | Status: DC
  Administered 2015-04-14 – 2015-04-15 (×2): 20 mg via ORAL
  Filled 2015-04-14 (×2): qty 1

## 2015-04-14 MED ORDER — OXYBUTYNIN CHLORIDE ER 5 MG PO TB24
5.0000 mg | ORAL_TABLET | Freq: Every day | ORAL | Status: DC
  Administered 2015-04-14 – 2015-04-15 (×2): 5 mg via ORAL
  Filled 2015-04-14 (×3): qty 1

## 2015-04-14 MED ORDER — SODIUM CHLORIDE 0.9 % IV SOLN
INTRAVENOUS | Status: DC
  Administered 2015-04-14: 3.2 [IU]/h via INTRAVENOUS
  Filled 2015-04-14: qty 2.5

## 2015-04-14 MED ORDER — INSULIN ASPART 100 UNIT/ML IV SOLN
10.0000 [IU] | Freq: Once | INTRAVENOUS | Status: AC
  Administered 2015-04-14: 10 [IU] via INTRAVENOUS
  Filled 2015-04-14: qty 1

## 2015-04-14 MED ORDER — ACETAMINOPHEN 325 MG PO TABS: 650.0000 mg | ORAL_TABLET | ORAL | Status: DC | PRN

## 2015-04-14 MED ORDER — ENOXAPARIN SODIUM 40 MG/0.4ML ~~LOC~~ SOLN
40.0000 mg | SUBCUTANEOUS | Status: DC
Start: 2015-04-14 — End: 2015-04-15
  Filled 2015-04-14: qty 0.4

## 2015-04-14 MED ORDER — SODIUM CHLORIDE 0.9 % IV SOLN
2.0000 g | Freq: Once | INTRAVENOUS | Status: AC
  Administered 2015-04-14: 2 g via INTRAVENOUS
  Filled 2015-04-14: qty 20

## 2015-04-14 MED ORDER — MORPHINE SULFATE ER 15 MG PO TBCR
15.0000 mg | EXTENDED_RELEASE_TABLET | Freq: Two times a day (BID) | ORAL | Status: DC
  Administered 2015-04-14 – 2015-04-15 (×3): 15 mg via ORAL
  Filled 2015-04-14 (×3): qty 1

## 2015-04-14 MED ORDER — NITROGLYCERIN 2 % TD OINT
TOPICAL_OINTMENT | TRANSDERMAL | Status: AC
  Filled 2015-04-14: qty 1

## 2015-04-14 MED ORDER — SODIUM CHLORIDE 0.9 % IV BOLUS (SEPSIS)
2000.0000 mL | Freq: Once | INTRAVENOUS | Status: AC
  Administered 2015-04-14: 2000 mL via INTRAVENOUS

## 2015-04-14 MED ORDER — SOTALOL HCL 80 MG PO TABS: 80.0000 mg | ORAL_TABLET | Freq: Two times a day (BID) | ORAL | Status: DC

## 2015-04-14 MED ORDER — DEXTROSE 50 % IV SOLN: 25.0000 mL | INTRAVENOUS | Status: DC | PRN

## 2015-04-14 MED ORDER — ONDANSETRON HCL 4 MG/2ML IJ SOLN
4.0000 mg | Freq: Four times a day (QID) | INTRAMUSCULAR | Status: DC | PRN
Start: 2015-04-14 — End: 2015-04-15

## 2015-04-14 MED ORDER — PANTOPRAZOLE SODIUM 40 MG PO TBEC
80.0000 mg | DELAYED_RELEASE_TABLET | Freq: Every evening | ORAL | Status: DC
  Administered 2015-04-14: 80 mg via ORAL
  Filled 2015-04-14: qty 2

## 2015-04-14 MED ORDER — ASPIRIN 325 MG PO TABS
325.0000 mg | ORAL_TABLET | Freq: Every day | ORAL | Status: DC
  Administered 2015-04-14 – 2015-04-15 (×2): 325 mg via ORAL
  Filled 2015-04-14 (×2): qty 1

## 2015-04-14 NOTE — ED Notes (Signed)
Pt. Signed contract for his Insulin Pump Therapy/.  Pt. Given the [place] Pump  flow sheet to document his information.  Pt. Verbalized understanding

## 2015-04-14 NOTE — Progress Notes (Signed)
Inpatient Diabetes Program Recommendations  AACE/ADA: New Consensus Statement on Inpatient Glycemic Control (2015)  Target Ranges:  Prepandial:   less than 140 mg/dL      Peak postprandial:   less than 180 mg/dL (1-2 hours)      Critically ill patients:  140 - 180 mg/dL   Patient admitted with DKA on insulin pump. [facility]. Patient is a late onset type 1 DM diagnosed at age 70. Patient brother also had DM 1. Last A1c 6.2%. Patient has med tronic pump.   Insulin pump settings, all values in units/hour: Standard Active  12am 1am 2am 3am 4am 5am 6am 7am 8am 9am 10am 11am  1.0   1.0     1.1     12pm 1pm 2pm 3pm 4pm 5pm 6pm 7pm 8pm 9pm 10pm 11pm      1.4         carb ratio: MN: 1:8 Sensitivity: 30 Target: 130-150 mg/dl Active time: 5 hours Type of insulin _Novolog Current TDBD: 27.7  Has off pump plan:  Lantus 28 units daily for basal rate replacement   Patient is alert and oriented and would do well in initiating his insulin pump back after he is cleared to do so by Dr. Isidoro Donningai. Please start insulin pump order set when appropriate and wife brings supplies from home. Per patient wife to bring supplies within the hour.  RN to fill out insulin pump contract and flow sheet with patient and keep at bedside. Will watch patient while here.  Note patient has glucose sensor on right lower quadrant. Ok to keep there while inpatient. Patient agreeable to having glucose checks with hospital meter.   Thanks,  Christena DeemShannon Joletta Manner RN, MSN, [facility] Inpatient Diabetes Coordinator Team Pager [phone] (8a-5p)

## 2015-04-14 NOTE — ED Notes (Signed)
CBG 111 

## 2015-04-14 NOTE — ED Notes (Signed)
Patient arrived via [facility]. EMS reports: patient from home, but [facility] [employer] [facility] [facility] [place] when chest pain began at 2300. Reported chest pain 10/10 radiating to L upon EMS arrival. Denied shortness of breath, diaphoresis, vomiting. Reported nausea. 18 gauge IV placed in left AC by EMS. Zofran 4 mg administered. EMS administered NTG 1 tab SL and ASA 325 mg. Patient has insulin pump with glucose monitoring system. Patient reported to EMS that he has been administering insulin throughout the day as instructed by pump. CBG 600. BP 158/40, Pulse 90, 96% on 2 LPM via nasal cannula. Chest pain 7/10 after 1 SL nitro tab.  Patient insisted he needed to have BM upon arrival to ED.

## 2015-04-14 NOTE — Progress Notes (Signed)
RN asking if pt can be admitted to tele instead of stepdown. This NP called and spoke to [place]. BP now 102. HR stable. CBGs back to normal and pt has been transitioned to insulin pump and he is independent with that. Had some CP and troponins are normal. Plan to [facility] next 1-2 weeks unless he decompensates here prior to discharge. After reviewing chart and speaking to RN, believe pt is stable enough to go to tele and not to SDU. [facility], NP [facility]

## 2015-04-14 NOTE — ED Notes (Signed)
Given 2 cups ice water per MD.

## 2015-04-14 NOTE — ED Notes (Signed)
Admitting MD at the Bedside. ECG completed and given to Dr. Royann Shiversroitoru

## 2015-04-14 NOTE — H&P (Signed)
[facility] History and Physical  Stephen Beasley NWG:[id number] DOB: [birth year] DOA: 04/14/2015  Referring physician: ED PCP: PROVIDER NOT IN SYSTEM   Chief Complaint: Chest pain and hyperglycemia  HPI: [name] is a 70 year old male with past medical history significant for diabetes mellitus type 1 on insulin pump, hypertension, dysrhythmia s/p ablation 1 year ago, and GERD; who presents with complaints of complaints of chest pain and hyperglycemia. Symptoms started around 6 or 8 PM tonight. After supper he noted that his blood sugars were in the 300s. He tried giving himself additional insulin all throughout the day and yesterday without relief of symptoms. Associated symptoms included soft stools which is unusual for him. His wife was trying to bring him to the hospital as he had began to develop complaints of chest pain radiating into the left arm. Associated symptoms included nausea. He denied having any shortness of breath, vomiting. Improvement of chest pain was seen after patient was given sublingual nitroglycerin. Otherwise prior to last 2 days patient had reported being in his normal state of health.  Upon admission patient was evaluated and seen to have a elevated blood glucose of 694, anion gap 15, CO2 19, BUN 26, potassium 5.4, and sodium 134. Patient was given 2 L of IV fluids with 10 units of subcutaneous insulin and insulin pump was turned off. Initial troponin noted to be 0.0. EKG was read as sinus rhythm with left axis deviation, and peaked T waves for which patient was given 2 g of calcium gluconate.  Review of Systems  Constitutional: Positive for malaise/fatigue. Negative for chills and weight loss.  HENT: Negative for ear pain and tinnitus.   Eyes: Negative for photophobia and pain.  Respiratory: Negative for sputum production, shortness of breath and wheezing.   Cardiovascular: Positive for chest pain. Negative for leg swelling.  Gastrointestinal: Positive for nausea and  diarrhea.  Genitourinary: Negative for frequency and hematuria.  Musculoskeletal: Positive for myalgias and joint pain.  Skin: Negative for itching and rash.  Neurological: Negative for seizures and loss of consciousness.  Endo/Heme/Allergies: Positive for environmental allergies and polydipsia.  Psychiatric/Behavioral: Negative for hallucinations. The patient is not nervous/anxious.   All other systems reviewed and are negative.    Past Medical History  Diagnosis Date  . Hypertension   . Dysrhythmia     Premature PVC's, and skipped beats- Dr. Arvilla MarketMills follows -[facility]  . Multiple allergies 01-26-12    Advair for allergies  . GERD (gastroesophageal reflux disease)   . Arthritis     mild hands, shoulders  . Diabetes mellitus without complication (HCC)     Insulin Pump (Medtronic Paradigm) being used  . Kidney stone     40 yrs ago  . Hyperthyroidism      Past Surgical History  Procedure Laterality Date  . Cardiac catheterization      6 yrs ago -negative for blockages  . Tonsillectomy    . Fracture surgery      Right ankle retained hardware  . Appendectomy    . Hernia repair      RIH x1, Umbilical X2  . Knee arthroscopy      right knee  . Cervical fusion      no retained hardware  . Shoulder open rotator cuff repair  01/30/2012    Procedure: ROTATOR CUFF REPAIR SHOULDER OPEN;  Surgeon: Jacki Conesonald A Gioffre, MD;  Location: [facility];  Service: Orthopedics;  Laterality: Left;  Left Shoulder open Rotator Cuff with acrominectomy and distal clavicle  resection.       Social History:  reports that he quit smoking about 33 years ago. His smoking use included Cigarettes. He smoked 0.50 packs per day. He does not have any smokeless tobacco history on file. He reports that he drinks alcohol. He reports that he does not use illicit drugs. Where does patient live--home, and with whom if at home? Wife Can patient participate in ADLs? Yes  Allergies  Allergen Reactions  . Augmentin  [Amoxicillin-Pot Clavulanate] Nausea And Vomiting  . Chocolate Nausea And Vomiting    White chocolate  . Lipitor [Atorvastatin] Other (See Comments)    Muscle cramps    No family history on file.      Prior to Admission medications   Medication Sig Start Date End Date Taking? Authorizing Provider  aspirin 325 MG tablet Take 325 mg by mouth daily.    Historical Provider, MD  clonazePAM (KLONOPIN) 1 MG tablet Take 1 mg by mouth 2 (two) times daily. For anxiety    Historical Provider, MD  cyanocobalamin (,VITAMIN B-12,) 1000 MCG/ML injection Inject 1,000 mcg into the muscle every 30 (thirty) days. Date every month varies - daughter administers    Historical Provider, MD  esomeprazole (NEXIUM) 40 MG capsule Take 40 mg by mouth every evening.     Historical Provider, MD  Fluticasone-Salmeterol (ADVAIR) 250-50 MCG/DOSE AEPB Inhale 1 puff into the lungs daily.    Historical Provider, MD  Insulin Human (INSULIN PUMP) 100 unit/ml SOLN Inject into the skin continuous. Novolog:  1.5 units/hour    Historical Provider, MD  levothyroxine (SYNTHROID, LEVOTHROID) 137 MCG tablet Take 137 mcg by mouth every morning.    Historical Provider, MD  methocarbamol (ROBAXIN) 500 MG tablet Take 500 mg by mouth daily as needed. For muscle spasms    Historical Provider, MD  mometasone (NASONEX) 50 MCG/ACT nasal spray Place 2 sprays into the nose daily.    Historical Provider, MD  Multiple Vitamin (MULTIVITAMIN WITH MINERALS) TABS Take 1 tablet by mouth every evening.     Historical Provider, MD  oxyCODONE-acetaminophen (PERCOCET/ROXICET) 5-325 MG per tablet Take 1 tablet by mouth daily as needed. For pain    Historical Provider, MD  ramipril (ALTACE) 10 MG capsule Take 20 mg by mouth daily.    Historical Provider, MD  simvastatin (ZOCOR) 20 MG tablet Take 20 mg by mouth every morning.    Historical Provider, MD  sotalol (BETAPACE) 80 MG tablet Take 80 mg by mouth 2 (two) times daily.    Historical Provider, MD      Physical Exam: Filed Vitals:   04/14/15 0300 04/14/15 0345 04/14/15 0400  BP:  103/82   Pulse: 87 85 84  Resp: SpO2: 96% 97% 100%     Constitutional: Vital signs reviewed. PatientIs in moderate distress complaining of chest pain. Comfortable on the hospital bed  Head: Normocephalic and atraumatic  Ear: TM normal bilaterally  Mouth: no erythema or exudates,dry mucous membranes  Eyes: PERRL, EOMI, conjunctivae normal, No scleral icterus.  Neck: Supple, Trachea midline normal ROM, No JVD, mass, thyromegaly, or carotid bruit present.  Cardiovascular: RRR, S1 normal, S2 normal, no MRG, pulses symmetric and intact bilaterally  Pulmonary/Chest: CTAB, no wheezes, rales, or rhonchi  Abdominal: Soft. Non-tender, non-distended, bowel sounds are normal, no masses, organomegaly, or guarding present.  GU: no CVA tenderness Musculoskeletal: No joint deformities, erythema, or stiffness, ROM full and no nontender Ext: no edema and no cyanosis, pulses palpable bilaterally (DP and PT)  Hematology: no cervical, inginal, or axillary adenopathy.  Neurological: A&O x3, Strenght is normal and symmetric bilaterally, cranial nerve II-XII are grossly intact, no focal motor deficit, sensory intact to light touch bilaterally.  Skin: Warm, dry and intact. No rash, cyanosis, or clubbing.  Psychiatric: Normal mood and affect. speech and behavior is normal. Judgment and thought content normal. Cognition and memory are normal.      Data Review   Micro Results No results found for this or any previous visit (from the past 240 hour(s)).  Radiology Reports Dg Chest 2 View  04/14/2015  CLINICAL DATA:  Chest pain and shortness of breath EXAM: CHEST  2 VIEW COMPARISON:  03/07/2012 FINDINGS: Normal heart size and mediastinal contours. Chronic mild basilar scarring, likely in the middle lobe. No acute infiltrate or edema. No effusion or pneumothorax. No acute osseous findings. IMPRESSION: No active  cardiopulmonary disease. Electronically Signed   By: Marnee Spring M.D.   On: 04/14/2015 03:19     CBC  Recent Labs Lab 04/14/15 0257 04/14/15 0307  WBC 10.5  --   HGB 12.4* 13.9  HCT 37.7* 41.0  PLT 183  --   MCV 92.6  --   MCH 30.5  --   MCHC 32.9  --   RDW 12.5  --   LYMPHSABS 0.8  --   MONOABS 0.7  --   EOSABS 0.1  --   BASOSABS 0.0  --     Chemistries   Recent Labs Lab 04/14/15 0257 04/14/15 0307  NA 134* 134*  K 5.4* 5.1  CL 100* 101  CO2 19*  --   GLUCOSE 694* 666*  BUN 26* 28*  CREATININE 1.44* 1.00  CALCIUM 9.2  --   MG 2.1  --    ------------------------------------------------------------------------------------------------------------------ CrCl cannot be calculated (Unknown ideal weight.). ------------------------------------------------------------------------------------------------------------------ No results for input(s): HGBA1C in the last 72 hours. ------------------------------------------------------------------------------------------------------------------ No results for input(s): CHOL, HDL, LDLCALC, TRIG, CHOLHDL, LDLDIRECT in the last 72 hours. ------------------------------------------------------------------------------------------------------------------ No results for input(s): TSH, T4TOTAL, T3FREE, THYROIDAB in the last 72 hours.  Invalid input(s): FREET3 ------------------------------------------------------------------------------------------------------------------ No results for input(s): VITAMINB12, FOLATE, FERRITIN, TIBC, IRON, RETICCTPCT in the last 72 hours.  Coagulation profile No results for input(s): INR, PROTIME in the last 168 hours.  No results for input(s): DDIMER in the last 72 hours.  Cardiac Enzymes No results for input(s): CKMB, TROPONINI, MYOGLOBIN in the last 168 hours.  Invalid input(s):  CK ------------------------------------------------------------------------------------------------------------------ Invalid input(s): POCBNP   CBG:  Recent Labs Lab 04/14/15 0255  GLUCAP >600*       EKG: Independently reviewed. Sinus rhythm with left axis deviation. Peak T waves noted  Assessment/Plan  Chest pain: Acute. Patient reports substernal chest pain radiating to the left arm with some mild relief with nitroglycerin. Initial EKG showed T waves, but no ST elevation. Initial troponin 0.0. Currently patient still reporting active chest pain symptoms 9 out of 10. - Admit to stepdown unit - Trend cardiac troponins - repeat EKG 6 AM - Check echocardiogram - Nitroglycerin paste  - Morphine prn chest pain - Cardiology consult in a.m.  Suspected DKA in type I diabetic: Patient's initial blood glucose was noted to be 694, anion gap 15, CO2 19, BUN 26. No urinalysis was ordered and his initial workup. - Check urinalysis stat - Instituted glucose stabilizer protocol - Recheck BMP in a.m.  Essential hypertension - Continue amlodipine and ramipril  Hypothyroidism - Continue levothyroxine  Hyperkalemia: Resolved. Initial potassium 5.4 with signs of peaked T waves  on EKG. Repeat potassium 5.1 - Continue to monitor  Acute kidney injury: Resolved. Initial creatinine 1.44 on repeat after IV fluids creatinine 1. Likely prerenal in nature. - Continue to monitor  Hyperlipidemia -Continue   Code Status:   full Family Communication: bedside Disposition Plan: admit   Total time spent 55 minutes.Greater than 50% of this time was spent in counseling, explanation of diagnosis, planning of further management, and coordination of care  Clydie Braun [facility] Pager [phone]  If 7PM-7AM, please contact night-coverage [email or web address] Password [email or web address] 04/14/2015, 4:14 AM

## 2015-04-14 NOTE — Progress Notes (Signed)
Attempted to get report from ED 

## 2015-04-14 NOTE — ED Notes (Signed)
cbg 281 

## 2015-04-14 NOTE — ED Provider Notes (Signed)
CSN: [id number]     Arrival date & time 04/14/15  0211 History  By signing my name below, I, Marisue Humble, attest that this documentation has been prepared under the direction and in the presence of Tomasita Crumble, MD . Electronically Signed: Marisue Humble, Scribe. 04/14/2015. 3:56 AM.   Chief Complaint  Patient presents with  . Chest Pain  . Hyperglycemia   The history is provided by the patient and the spouse. No language interpreter was used.   HPI Comments:  Stephen Beasley is a 70 y.o. male with PMHx of HTN, DM and GERD who presents to the Emergency Department via EMS complaining of chest pain and pressure radiating to left arm and neck onset 2300 yesterday. Pt reports associated nausea and diaphoresis. No alleviating or exacerbating factors noted. Pt also states he had persistent hyperglycemia yesterday. He states he has been administering insulin via insulin pump throughout the day with no relief. Pt reports heart ablation ~ 40yr ago.Pt denies SOB, vomiting, fever, cough, or rhinorrhea.  Past Medical History  Diagnosis Date  . Hypertension   . Dysrhythmia     Premature PVC's, and skipped beats- Dr. Arvilla Market follows -[facility]  . Multiple allergies 01-26-12    Advair for allergies  . GERD (gastroesophageal reflux disease)   . Arthritis     mild hands, shoulders  . Diabetes mellitus without complication (HCC)     Insulin Pump (Medtronic Paradigm) being used  . Kidney stone     40 yrs ago  . Hyperthyroidism    Past Surgical History  Procedure Laterality Date  . Cardiac catheterization      6 yrs ago -negative for blockages  . Tonsillectomy    . Fracture surgery      Right ankle retained hardware  . Appendectomy    . Hernia repair      RIH x1, Umbilical X2  . Knee arthroscopy      right knee  . Cervical fusion      no retained hardware  . Shoulder open rotator cuff repair  01/30/2012    Procedure: ROTATOR CUFF REPAIR SHOULDER OPEN;  Surgeon: Jacki Cones, MD;   Location: [facility];  Service: Orthopedics;  Laterality: Left;  Left Shoulder open Rotator Cuff with acrominectomy and distal clavicle resection.    No family history on file. Social History  Substance Use Topics  . Smoking status: Former Smoker -- 0.50 packs/day    Types: Cigarettes    Quit date: 01/25/1982  . Smokeless tobacco: None  . Alcohol Use: 0.0 oz/week    1-2 Cans of beer per week     Comment: occ.    Review of Systems  Constitutional: Positive for diaphoresis. Negative for fever.  HENT: Negative for rhinorrhea.   Respiratory: Negative for cough and shortness of breath.   Cardiovascular: Positive for chest pain.  Gastrointestinal: Positive for nausea. Negative for vomiting.  Musculoskeletal: Positive for arthralgias and neck pain.  All other systems reviewed and are negative.  Allergies  Augmentin; Chocolate; and Lipitor  Home Medications   Prior to Admission medications   Medication Sig Start Date End Date Taking? Authorizing Provider  aspirin 325 MG tablet Take 325 mg by mouth daily.    Historical Provider, MD  clonazePAM (KLONOPIN) 1 MG tablet Take 1 mg by mouth 2 (two) times daily. For anxiety    Historical Provider, MD  cyanocobalamin (,VITAMIN B-12,) 1000 MCG/ML injection Inject 1,000 mcg into the muscle every 30 (thirty) days. Date every month  varies - daughter administers    Historical Provider, MD  esomeprazole (NEXIUM) 40 MG capsule Take 40 mg by mouth every evening.     Historical Provider, MD  Fluticasone-Salmeterol (ADVAIR) 250-50 MCG/DOSE AEPB Inhale 1 puff into the lungs daily.    Historical Provider, MD  Insulin Human (INSULIN PUMP) 100 unit/ml SOLN Inject into the skin continuous. Novolog:  1.5 units/hour    Historical Provider, MD  levothyroxine (SYNTHROID, LEVOTHROID) 137 MCG tablet Take 137 mcg by mouth every morning.    Historical Provider, MD  methocarbamol (ROBAXIN) 500 MG tablet Take 500 mg by mouth daily as needed. For muscle spasms    Historical  Provider, MD  mometasone (NASONEX) 50 MCG/ACT nasal spray Place 2 sprays into the nose daily.    Historical Provider, MD  Multiple Vitamin (MULTIVITAMIN WITH MINERALS) TABS Take 1 tablet by mouth every evening.     Historical Provider, MD  oxyCODONE-acetaminophen (PERCOCET/ROXICET) 5-325 MG per tablet Take 1 tablet by mouth daily as needed. For pain    Historical Provider, MD  ramipril (ALTACE) 10 MG capsule Take 20 mg by mouth daily.    Historical Provider, MD  simvastatin (ZOCOR) 20 MG tablet Take 20 mg by mouth every morning.    Historical Provider, MD  sotalol (BETAPACE) 80 MG tablet Take 80 mg by mouth 2 (two) times daily.    Historical Provider, MD   BP 103/82 mmHg  Pulse 85  Resp 15  SpO2 97% Physical Exam  Constitutional: He is oriented to person, place, and time. Vital signs are normal. He appears well-developed and well-nourished.  Non-toxic appearance. He does not appear ill. No distress.  HENT:  Head: Normocephalic and atraumatic.  Nose: Nose normal.  Mouth/Throat: Oropharynx is clear and moist. No oropharyngeal exudate.  Eyes: Conjunctivae and EOM are normal. Pupils are equal, round, and reactive to light. No scleral icterus.  Neck: Normal range of motion. Neck supple. No tracheal deviation, no edema, no erythema and normal range of motion present. No thyroid mass and no thyromegaly present.  Cardiovascular: Normal rate, regular rhythm, S1 normal, S2 normal, normal heart sounds, intact distal pulses and normal pulses.  Exam reveals no gallop and no friction rub.   No murmur heard. Pulmonary/Chest: Effort normal and breath sounds normal. No respiratory distress. He has no wheezes. He has no rhonchi. He has no rales.  Abdominal: Soft. Normal appearance and bowel sounds are normal. He exhibits no distension, no ascites and no mass. There is no hepatosplenomegaly. There is no tenderness. There is no rebound, no guarding and no CVA tenderness.  Insulin pump going into abdominal wall   Musculoskeletal: Normal range of motion. He exhibits no edema or tenderness.  Lymphadenopathy:    He has no cervical adenopathy.  Neurological: He is alert and oriented to person, place, and time. He has normal strength. No cranial nerve deficit or sensory deficit.  Skin: Skin is warm, dry and intact. No petechiae and no rash noted. He is not diaphoretic. No erythema. No pallor.  Psychiatric: He has a normal mood and affect. His behavior is normal. Judgment normal.  Nursing note and vitals reviewed.  ED Course  Procedures  DIAGNOSTIC STUDIES:  COORDINATION OF CARE:  2:59 AM Will order chest x-ray and blood work. Discussed treatment plan with pt at bedside and pt agreed to plan.  Labs Review Labs Reviewed  CBC WITH DIFFERENTIAL/PLATELET - Abnormal; Notable for the following:    RBC 4.07 (*)    Hemoglobin 12.4 (*)  HCT 37.7 (*)    Neutro Abs 8.8 (*)    All other components within normal limits  BASIC METABOLIC PANEL - Abnormal; Notable for the following:    Sodium 134 (*)    Potassium 5.4 (*)    Chloride 100 (*)    CO2 19 (*)    Glucose, Bld 694 (*)    BUN 26 (*)    Creatinine, Ser 1.44 (*)    GFR calc non Af Amer 48 (*)    GFR calc Af Amer 56 (*)    All other components within normal limits  CBG MONITORING, ED - Abnormal; Notable for the following:    Glucose-Capillary >600 (*)    All other components within normal limits  I-STAT CHEM 8, ED - Abnormal; Notable for the following:    Sodium 134 (*)    BUN 28 (*)    Glucose, Bld 666 (*)    Calcium, Ion 1.12 (*)    All other components within normal limits  MAGNESIUM  URINALYSIS, ROUTINE W REFLEX MICROSCOPIC (NOT AT [facility])  Rosezena SensorI-STAT TROPOININ, ED    Imaging Review Dg Chest 2 View  04/14/2015  CLINICAL DATA:  Chest pain and shortness of breath EXAM: CHEST  2 VIEW COMPARISON:  03/07/2012 FINDINGS: Normal heart size and mediastinal contours. Chronic mild basilar scarring, likely in the middle lobe. No acute infiltrate or  edema. No effusion or pneumothorax. No acute osseous findings. IMPRESSION: No active cardiopulmonary disease. Electronically Signed   By: Marnee SpringJonathon  Watts M.D.   On: 04/14/2015 03:19   I have personally reviewed and evaluated these images and lab results as part of my medical decision-making.   EKG Interpretation   Date/Time:  Wednesday April 14 2015 02:28:47 EDT Ventricular Rate:  85 PR Interval:  176 QRS Duration: 102 QT Interval:  396 QTC Calculation: 471 R Axis:   -17 Text Interpretation:  Sinus rhythm Borderline left axis deviation Baseline  wander in lead(s) V1 Hyperkalemia new peaked t waves Confirmed by Erroll Lunani,  Basim Bartnik Ayokunle [phone]([id number]) on 04/14/2015 2:45:28 AM      MDM   Final diagnoses:  None    Patient presents to the ED for chest pain and hyperglycemia.  His history is concerning for ACS with the pressure and radiation with nauseas and diaphoresis.  Cardiac ischemia is a well known cause of hyperglycemia in diabetics, I have concern that these are related.  Troponin is negative and EKG is unremarkable.   BS is >600.  Patient given 2L IVF and 10u IV insulin.  Dr. Katrinka BlazingSmith accepts the patient to step down for further care.   CRITICAL CARE Performed by: Tomasita CrumbleNI,Darletta Noblett   Total critical care time: 40 minutes - hypreglycemia, req insulin IV  Critical care time was exclusive of separately billable procedures and treating other patients.  Critical care was necessary to treat or prevent imminent or life-threatening deterioration.  Critical care was time spent personally by me on the following activities: development of treatment plan with patient and/or surrogate as well as nursing, discussions with consultants, evaluation of patient's response to treatment, examination of patient, obtaining history from patient or surrogate, ordering and performing treatments and interventions, ordering and review of laboratory studies, ordering and review of radiographic studies, pulse oximetry and  re-evaluation of patient's condition.   I personally performed the services described in this documentation, which was scribed in my presence. The recorded information has been reviewed and is accurate.      Tomasita CrumbleAdeleke Gaetana Kawahara, MD 04/14/15 [phone]

## 2015-04-14 NOTE — ED Notes (Signed)
Pt. Is starting his own Insulin pump.  Dr. Isidoro Donningai is aware.

## 2015-04-14 NOTE — ED Notes (Signed)
Patient just returned from xray

## 2015-04-14 NOTE — Progress Notes (Signed)
[facility]                                                                              Patient Demographics  Stephen Beasley, is a 70 y.o. male, DOB - [birth year], YNW:[id number]  Admit date - 04/14/2015   Admitting Physician No admitting provider for patient encounter.  Outpatient Primary MD for the patient's PCP at [facility]   LOS - 0  days    Chief Complaint  Patient presents with  . Chest Pain  . Hyperglycemia       Brief HPI   Patient is a 70 year old male with diabetes mellitus type 1 on insulin pump, hypertension, dysrhythmia status post ablation, GERD, primary cardiologist and PCP at [facility] presented with chest pain and hyperglycemia. Patient reported that he was in his baseline state of health until evening, 2 days ago when he noticed his blood sugars were trending up initially in 300s. He tried giving himself additional insulin. He also reported intermittent chest pain radiating into the left arm with nausea. In ED, patient was found to have blood glucose 694, anion gap 15, CO2 19, UA positive for ketones. Patient was admitted for chest pain workup and DKA.   Assessment & Plan    Principal Problem: Chest pain:  - Risk factors diabetes mellitus, chest pain at rest.  - Cardiology consulted, recommended stress test, however patient has his usual cardiologist, Dr. Bascom Levels males at [facility]. Patient does not mind pursuing stress test with his own cardiologist. - Serial cardiac enzymes 2 negative, follow echo   DKA in type I diabetic: Patient's initial blood glucose was noted to be 694, anion gap 15, CO2 19, BUN 26. UA positive for ketones  - Continue IV insulin drip, until 4 consecutive CBGs less than 180, gap is now closed 9. Requested diabetic coordinator consult as well as patient is on insulin pump.   Essential hypertension - Continue amlodipine and ramipril  Hypothyroidism - Continue levothyroxine  Hyperkalemia: Resolved. Initial potassium  5.4 with signs of peaked T waves on EKG. Repeat potassium 5.1 - Resolved with insulin drip  Acute kidney injury: Resolved, likely prerenal.. Initial creatinine 1.44  - Resolved with IV fluid hydration  Hyperlipidemia -Continue Zocor  Code Status: Full code   Family Communication: Discussed in detail with the patient, all imaging results, lab results explained to the patient    Disposition Plan: Hopefully DC home in am  Time Spent in minutes 24 minutes  Procedures  None   Consults   Cards   DVT Prophylaxis  Lovenox   Medications  Scheduled Meds: . amLODipine  2.5 mg Oral Daily  . aspirin  325 mg Oral Daily  . clonazePAM  1 mg Oral BID  . enoxaparin (LOVENOX) injection  40 mg Subcutaneous Q24H  . fluticasone furoate-vilanterol  1 puff Inhalation Daily  . insulin regular  0-10 Units Intravenous TID WC  . levothyroxine  137 mcg Oral QAC breakfast  . morphine  15 mg Oral Q12H  . multivitamin with minerals  1 tablet Oral QPM  . nitroGLYCERIN      . oxybutynin  5 mg Oral Daily  .  pantoprazole  80 mg Oral QPM  . ramipril  20 mg Oral Daily  . simvastatin  20 mg Oral q morning - 10a  . sodium chloride flush  3 mL Intravenous Q12H   Continuous Infusions: . sodium chloride 125 mL/hr at 04/14/15 0551  . dextrose 5 % and 0.45% NaCl 125 mL/hr at 04/14/15 1143  . insulin (NOVOLIN-R) infusion 3.1 Units/hr (04/14/15 1314)   PRN Meds:.acetaminophen, dextrose, gi cocktail, morphine injection, ondansetron (ZOFRAN) IV, oxyCODONE-acetaminophen   Antibiotics   Anti-infectives    None        Subjective:   Corene CorneaHarvey Mcconico was seen and examined today.  Chest pain improving. No shortness of breath. Patient denies dizziness,  abdominal pain, N/V/D/C, new weakness, numbess, tingling. No acute events overnight.    Objective:   Filed Vitals:   04/14/15 0900 04/14/15 0915 04/14/15 1000 04/14/15 1100  BP: 116/65 92/43 99/52  105/58  Pulse: 82 84 82 82  Resp: 12 17 19 23   SpO2: 97%  96% 94% 96%    Intake/Output Summary (Last 24 hours) at 04/14/15 1332 Last data filed at 04/14/15 1150  Gross per 24 hour  Intake   2000 ml  Output    908 ml  Net   1092 ml     Wt Readings from Last 3 Encounters:  03/07/12 112.492 kg (248 lb)  01/31/12 112.038 kg (247 lb)  01/26/12 112.038 kg (247 lb)     Exam  General: Alert and oriented x 3, NAD  HEENT:  PERRLA, EOMI, Anicteric Sclera, mucous membranes moist.   Neck: Supple, no JVD, no masses  CVS: S1 S2 auscultated, no rubs, murmurs or gallops. Regular rate and rhythm.  Respiratory: Clear to auscultation bilaterally, no wheezing, rales or rhonchi  Abdomen: Soft, nontender, nondistended, + bowel sounds  Ext: no cyanosis clubbing or edema  Neuro: AAOx3, Cr N's II- XII. Strength 5/5 upper and lower extremities bilaterally  Skin: No rashes  Psych: Normal affect and demeanor, alert and oriented x3    Data Reviewed:  I have personally reviewed following labs and imaging studies  Micro Results No results found for this or any previous visit (from the past 240 hour(s)).  Radiology Reports Dg Chest 2 View  04/14/2015  CLINICAL DATA:  Chest pain and shortness of breath EXAM: CHEST  2 VIEW COMPARISON:  03/07/2012 FINDINGS: Normal heart size and mediastinal contours. Chronic mild basilar scarring, likely in the middle lobe. No acute infiltrate or edema. No effusion or pneumothorax. No acute osseous findings. IMPRESSION: No active cardiopulmonary disease. Electronically Signed   By: Marnee SpringJonathon  Watts M.D.   On: 04/14/2015 03:19    CBC  Recent Labs Lab 04/14/15 0257 04/14/15 0307  WBC 10.5  --   HGB 12.4* 13.9  HCT 37.7* 41.0  PLT 183  --   MCV 92.6  --   MCH 30.5  --   MCHC 32.9  --   RDW 12.5  --   LYMPHSABS 0.8  --   MONOABS 0.7  --   EOSABS 0.1  --   BASOSABS 0.0  --     Chemistries   Recent Labs Lab 04/14/15 0257 04/14/15 0307 04/14/15 0819 04/14/15 1216  NA 134* 134* 138 141  K 5.4* 5.1 4.0  3.8  CL 100* 101 109 110  CO2 19*  --  20* 22  GLUCOSE 694* 666* 337* 204*  BUN 26* 28* 25* 24*  CREATININE 1.44* 1.00 1.17 1.14  CALCIUM 9.2  --  9.2 9.3  MG 2.1  --   --   --    ------------------------------------------------------------------------------------------------------------------ CrCl cannot be calculated (Unknown ideal weight.). ------------------------------------------------------------------------------------------------------------------ No results for input(s): HGBA1C in the last 72 hours. ------------------------------------------------------------------------------------------------------------------ No results for input(s): CHOL, HDL, LDLCALC, TRIG, CHOLHDL, LDLDIRECT in the last 72 hours. ------------------------------------------------------------------------------------------------------------------ No results for input(s): TSH, T4TOTAL, T3FREE, THYROIDAB in the last 72 hours.  Invalid input(s): FREET3 ------------------------------------------------------------------------------------------------------------------ No results for input(s): VITAMINB12, FOLATE, FERRITIN, TIBC, IRON, RETICCTPCT in the last 72 hours.  Coagulation profile No results for input(s): INR, PROTIME in the last 168 hours.  No results for input(s): DDIMER in the last 72 hours.  Cardiac Enzymes  Recent Labs Lab 04/14/15 0517 04/14/15 0818  TROPONINI <0.03 <0.03   ------------------------------------------------------------------------------------------------------------------ Invalid input(s): POCBNP   Recent Labs  04/14/15 0543 04/14/15 0721 04/14/15 0917 04/14/15 1034 04/14/15 1141 04/14/15 1258  GLUCAP 383* 329* 281* 247* 208* 137*     RAI,RIPUDEEP M.D. [facility] 04/14/2015, 1:32 PM  Pager: [phone] Between 7am to 7pm - call Pager - [phone]  After 7pm go to [email or web address] - password [email or web address]  Call night coverage person covering after 7pm

## 2015-04-14 NOTE — ED Notes (Signed)
CBG 383 

## 2015-04-14 NOTE — ED Notes (Signed)
Spoke with Dr. Isidoro Donningai about pt. Having Basal insulin when we transition him off the Insulin Pump.  No orders received for Lantus.

## 2015-04-14 NOTE — Consult Note (Signed)
Reason for Consult: Chest pain  Requesting Physician: Danielle DessPai  Cardiologist: Read DriversFrazier-Mills ([facility])  HPI: This is a 70 y.o. male with a past medical history significant for late onset type 1 diabetes mellitus, currently on an insulin pump, presenting with severe hyperglycemia, mild hyperkalemia and pressure-like retrosternal chest pain. He reports that he has experienced similar, less severe chest symptoms whenever his glucose exceeds 300. He has not had these symptoms or glucose elevated to this degree (694) except when the diagnosis was initially made. His chest discomfort has steadily improved as his glucose levels have been decreasing  He has had extensive previous cardiac workup, mostly due to symptomatic arrhythmia. He [facility] EP [facility] and [facility]. Cardiac MRI showed normal cardiac structure (both right and left ventricles). Coronary angiography performed roughly 10 years ago did not show any coronary stenoses and did not even mention any luminal irregularities or plaque. His most recent normal nuclear stress test was in May 2013. He does not have any vascular complications. Multiple echocardiograms and stress tests have shown normal left ventricle is function. He remains on low-dose sotalol for arrhythmia suppression.  ECG on admission showed hyper acute/peaked T waves in multiple leads, but these have normalized completely after normalization of potassium level. QTc interval is normal at 443 ms. Initial point of care troponin and a phlebotomy troponin performed 2 hours later have both been undetectable.  He has had 3 episodes of diarrhea since arriving to the emergency room. He has not had fever/chills, cough, rash, abdominal pain or overt bleeding. His white blood cell count is borderline elevated. His chest x-ray is clear.  He takes a relatively low dose of statin and has a good recent lipid profile. About a year ago LDL cholesterol was 83.  Diabetes control is usually excellent with hemoglobin A1c of only 6.2%.  PMHx:  Past Medical History  Diagnosis Date  . Hypertension   . Dysrhythmia     Premature PVC's, and skipped beats- Dr. Arvilla MarketMills follows -[facility]  . Multiple allergies 01-26-12    Advair for allergies  . GERD (gastroesophageal reflux disease)   . Arthritis     mild hands, shoulders  . Diabetes mellitus without complication (HCC)     Insulin Pump (Medtronic Paradigm) being used  . Kidney stone     40 yrs ago  . Hyperthyroidism    Past Surgical History  Procedure Laterality Date  . Cardiac catheterization      6 yrs ago -negative for blockages  . Tonsillectomy    . Fracture surgery      Right ankle retained hardware  . Appendectomy    . Hernia repair      RIH x1, Umbilical X2  . Knee arthroscopy      right knee  . Cervical fusion      no retained hardware  . Shoulder open rotator cuff repair  01/30/2012    Procedure: ROTATOR CUFF REPAIR SHOULDER OPEN;  Surgeon: Jacki Conesonald A Gioffre, MD;  Location: [facility];  Service: Orthopedics;  Laterality: Left;  Left Shoulder open Rotator Cuff with acrominectomy and distal clavicle resection.     FAMHx: No family history on file.  SOCHx:  reports that he quit smoking about 33 years ago. His smoking use included Cigarettes. He smoked 0.50 packs per day. He does not have any smokeless tobacco history on file. He reports that he drinks alcohol. He reports that he does not use illicit drugs.  ALLERGIES:  Allergies  Allergen Reactions  . Augmentin [Amoxicillin-Pot Clavulanate] Nausea And Vomiting  . Chocolate Nausea And Vomiting    White chocolate  . Lipitor [Atorvastatin] Other (See Comments)    Muscle cramps    ROS: Pertinent items noted in HPI and remainder of comprehensive ROS otherwise negative.  No current facility-administered medications on file prior to encounter.   Current Outpatient Prescriptions on File Prior to Encounter  Medication Sig Dispense  Refill  . aspirin 325 MG tablet Take 325 mg by mouth daily.    . clonazePAM (KLONOPIN) 1 MG tablet Take 1 mg by mouth 2 (two) times daily. For anxiety    . cyanocobalamin (,VITAMIN B-12,) 1000 MCG/ML injection Inject 1,000 mcg into the muscle every 30 (thirty) days. Date every month varies - daughter administers    . esomeprazole (NEXIUM) 40 MG capsule Take 40 mg by mouth every evening.     . Fluticasone-Salmeterol (ADVAIR) 250-50 MCG/DOSE AEPB Inhale 1 puff into the lungs daily.    . Insulin Human (INSULIN PUMP) 100 unit/ml SOLN Inject into the skin continuous. Novolog:  1.5 units/hour    . levothyroxine (SYNTHROID, LEVOTHROID) 137 MCG tablet Take 137 mcg by mouth every morning.    . mometasone (NASONEX) 50 MCG/ACT nasal spray Place 2 sprays into the nose daily.    . Multiple Vitamin (MULTIVITAMIN WITH MINERALS) TABS Take 1 tablet by mouth every evening.     . ramipril (ALTACE) 10 MG capsule Take 20 mg by mouth daily.    . simvastatin (ZOCOR) 20 MG tablet Take 20 mg by mouth every morning.      HOSPITAL MEDICATIONS:  Current facility-administered medications:  .  0.9 %  sodium chloride infusion, , Intravenous, Continuous, Clydie Braun, MD, Last Rate: 125 mL/hr at 04/14/15 0551 .  acetaminophen (TYLENOL) tablet 650 mg, 650 mg, Oral, Q4H PRN, Clydie Braun, MD .  amLODipine (NORVASC) tablet 2.5 mg, 2.5 mg, Oral, Daily, Clydie Braun, MD .  aspirin tablet 325 mg, 325 mg, Oral, Daily, Rondell A Katrinka Blazing, MD .  clonazePAM (KLONOPIN) tablet 1 mg, 1 mg, Oral, BID, Rondell A Smith, MD .  dextrose 5 %-0.45 % sodium chloride infusion, , Intravenous, Continuous, Rondell A Smith, MD .  dextrose 50 % solution 25 mL, 25 mL, Intravenous, PRN, Clydie Braun, MD .  enoxaparin (LOVENOX) injection 40 mg, 40 mg, Subcutaneous, Q24H, Rondell A Smith, MD .  fluticasone furoate-vilanterol (BREO ELLIPTA) 200-25 MCG/INH 1 puff, 1 puff, Inhalation, Daily, Clydie Braun, MD .  gi cocktail  (Maalox,Lidocaine,Donnatal), 30 mL, Oral, QID PRN, Clydie Braun, MD .  insulin regular (NOVOLIN R,HUMULIN R) 250 Units in sodium chloride 0.9 % 250 mL (1 Units/mL) infusion, , Intravenous, Continuous, Rondell A Smith, MD, Last Rate: 5.4 mL/hr at 04/14/15 0740, 5.4 Units/hr at 04/14/15 0740 .  insulin regular bolus via infusion 0-10 Units, 0-10 Units, Intravenous, TID WC, Clydie Braun, MD, 0 Units at 04/14/15 0809 .  levothyroxine (SYNTHROID, LEVOTHROID) tablet 137 mcg, 137 mcg, Oral, QAC breakfast, Rondell A Katrinka Blazing, MD .  morphine (MS CONTIN) 12 hr tablet 15 mg, 15 mg, Oral, Q12H, Rondell A Smith, MD .  morphine 2 MG/ML injection 2 mg, 2 mg, Intravenous, Q2H PRN, Clydie Braun, MD .  multivitamin with minerals tablet 1 tablet, 1 tablet, Oral, QPM, Rondell A Smith, MD .  nitroGLYCERIN (NITROGLYN) 2 % ointment, , , ,  .  ondansetron (ZOFRAN) injection 4 mg, 4 mg, Intravenous, Q6H PRN, Rondell A  Katrinka Blazing, MD .  oxybutynin (DITROPAN-XL) 24 hr tablet 5 mg, 5 mg, Oral, Daily, Rondell A Katrinka Blazing, MD .  oxyCODONE-acetaminophen (PERCOCET) 7.5-325 MG per tablet 1 tablet, 1 tablet, Oral, Q6H PRN, Clydie Braun, MD .  pantoprazole (PROTONIX) EC tablet 80 mg, 80 mg, Oral, QPM, Rondell A Smith, MD .  ramipril (ALTACE) capsule 20 mg, 20 mg, Oral, Daily, Rondell A Katrinka Blazing, MD .  simvastatin (ZOCOR) tablet 20 mg, 20 mg, Oral, q morning - 10a, Rondell A Smith, MD .  sodium chloride flush (NS) 0.9 % injection 3 mL, 3 mL, Intravenous, Q12H, Rondell A Smith, MD .  sotalol (BETAPACE) tablet 80 mg, 80 mg, Oral, BID, Clydie Braun, MD  Current outpatient prescriptions:  .  amLODipine (NORVASC) 5 MG tablet, Take 2.5 mg by mouth daily., Disp: , Rfl:  .  aspirin 325 MG tablet, Take 325 mg by mouth daily., Disp: , Rfl:  .  clonazePAM (KLONOPIN) 1 MG tablet, Take 1 mg by mouth 2 (two) times daily. For anxiety, Disp: , Rfl:  .  cyanocobalamin (,VITAMIN B-12,) 1000 MCG/ML injection, Inject 1,000 mcg into the muscle every  30 (thirty) days. Date every month varies - daughter administers, Disp: , Rfl:  .  esomeprazole (NEXIUM) 40 MG capsule, Take 40 mg by mouth every evening. , Disp: , Rfl:  .  Fluticasone-Salmeterol (ADVAIR) 250-50 MCG/DOSE AEPB, Inhale 1 puff into the lungs daily., Disp: , Rfl:  .  Insulin Human (INSULIN PUMP) 100 unit/ml SOLN, Inject into the skin continuous. Novolog:  1.5 units/hour, Disp: , Rfl:  .  levothyroxine (SYNTHROID, LEVOTHROID) 137 MCG tablet, Take 137 mcg by mouth every morning., Disp: , Rfl:  .  mometasone (NASONEX) 50 MCG/ACT nasal spray, Place 2 sprays into the nose daily., Disp: , Rfl:  .  morphine (MS CONTIN) 15 MG 12 hr tablet, Take 15 mg by mouth every 12 (twelve) hours., Disp: , Rfl:  .  Multiple Vitamin (MULTIVITAMIN WITH MINERALS) TABS, Take 1 tablet by mouth every evening. , Disp: , Rfl:  .  oxybutynin (DITROPAN-XL) 5 MG 24 hr tablet, Take 5 mg by mouth daily., Disp: , Rfl:  .  oxyCODONE-acetaminophen (PERCOCET) 7.5-325 MG tablet, Take 1 tablet by mouth every 6 (six) hours as needed., Disp: , Rfl:  .  ramipril (ALTACE) 10 MG capsule, Take 20 mg by mouth daily., Disp: , Rfl:  .  simvastatin (ZOCOR) 20 MG tablet, Take 20 mg by mouth every morning., Disp: , Rfl:  VITALS: Blood pressure 132/64, pulse 88, resp. rate 22, SpO2 97 %.  PHYSICAL EXAM:  General: Alert, oriented x3, no distress Head: no evidence of trauma, PERRL, EOMI, no exophtalmos or lid lag, no myxedema, no xanthelasma; normal ears, nose and oropharynx Neck: Flat jugular venous pulsations and no hepatojugular reflux; brisk carotid pulses without delay and no carotid bruits Chest: clear to auscultation, no signs of consolidation by percussion or palpation, normal fremitus, symmetrical and full respiratory excursions Cardiovascular: normal position and quality of the apical impulse, regular rhythm, normal first heart sound and normal second heart sound, no rubs or gallops, no murmur Abdomen: no tenderness or  distention, no masses by palpation, no abnormal pulsatility or arterial bruits, normal bowel sounds, no hepatosplenomegaly Extremities: no clubbing, cyanosis;  no edema; 2+ radial, ulnar and brachial pulses bilaterally; 2+ right femoral, posterior tibial and dorsalis pedis pulses; 2+ left femoral, posterior tibial and dorsalis pedis pulses; no subclavian or femoral bruits Neurological: grossly nonfocal   LABS  CBC  Recent Labs  04/14/15 0257 04/14/15 0307  WBC 10.5  --   NEUTROABS 8.8*  --   HGB 12.4* 13.9  HCT 37.7* 41.0  MCV 92.6  --   PLT 183  --    Basic Metabolic Panel  Recent Labs  04/14/15 0257 04/14/15 0307  NA 134* 134*  K 5.4* 5.1  CL 100* 101  CO2 19*  --   GLUCOSE 694* 666*  BUN 26* 28*  CREATININE 1.44* 1.00  CALCIUM 9.2  --   MG 2.1  --    Liver Function Tests No results for input(s): AST, ALT, ALKPHOS, BILITOT, PROT, ALBUMIN in the last 72 hours. No results for input(s): LIPASE, AMYLASE in the last 72 hours. Cardiac Enzymes  Recent Labs  04/14/15 0517  TROPONINI <0.03    IMAGING: Dg Chest 2 View  04/14/2015  CLINICAL DATA:  Chest pain and shortness of breath EXAM: CHEST  2 VIEW COMPARISON:  03/07/2012 FINDINGS: Normal heart size and mediastinal contours. Chronic mild basilar scarring, likely in the middle lobe. No acute infiltrate or edema. No effusion or pneumothorax. No acute osseous findings. IMPRESSION: No active cardiopulmonary disease. Electronically Signed   By: Marnee Spring M.D.   On: 04/14/2015 03:19    ECG: Current ECG is normal, normal sinus rhythm. Peaked T waves on admission ECG  TELEMETRY:  sinus rhythm  IMPRESSION/RECOMMENDATION: 1. Chest pain at rest without high risk features on ECG and biochemical markers. If his symptoms continue to improve and resolve and they are not reproduced with ambulation, further cardiac workup is not emergent. The liver, he has numerous coronary risk factors, especially insulin requiring diabetes  mellitus and not withstanding his previous normal coronary angiogram and relatively recent normal nuclear stress test, I would recommend a repeat stress test. However, he would prefer to pursue further evaluation with his usual cardiologist Dr. Lollie Sails at [facility]. This is reasonable as long as an appointment can be arranged in the next 1 or 2 weeks. 2. Type 1 diabetes mellitus with acute decompensation/mild DKA, improving with intravenous insulin drip. 3. History of symptomatic PVCs status post ablation, currently asymptomatic. On sotalol doubt excessive QT interval prolongation. 4. Hyperlipidemia on statin therapy    Time Spent Directly with Patient: 30 minutes  Thurmon Fair, MD, [facility] [phone] office [phone] pager   04/14/2015, 8:39 AM

## 2015-04-14 NOTE — ED Notes (Signed)
Unable to start IV fluids at this time. As soon as RN came into room, patient insisted that he needed to use bathroom for BM again. Ambulated beside spouse. Gait steady.

## 2015-04-15 ENCOUNTER — Inpatient Hospital Stay (HOSPITAL_COMMUNITY): Payer: Medicare Other

## 2015-04-15 LAB — HEMOGLOBIN A1C
Hgb A1c MFr Bld: 7.2 % — ABNORMAL HIGH (ref 4.8–5.6)
MEAN PLASMA GLUCOSE: 160 mg/dL

## 2015-04-15 LAB — BASIC METABOLIC PANEL
ANION GAP: 10 (ref 5–15)
BUN: 17 mg/dL (ref 6–20)
CALCIUM: 8.4 mg/dL — AB (ref 8.9–10.3)
CO2: 20 mmol/L — ABNORMAL LOW (ref 22–32)
CREATININE: 0.94 mg/dL (ref 0.61–1.24)
Chloride: 111 mmol/L (ref 101–111)
GFR calc Af Amer: >60 mL/min (ref >60)
GLUCOSE: 122 mg/dL — AB (ref 65–99)
Potassium: 3.8 mmol/L (ref 3.5–5.1)
Sodium: 141 mmol/L (ref 135–145)

## 2015-04-15 LAB — GLUCOSE, CAPILLARY: Glucose-Capillary: 105 mg/dL — ABNORMAL HIGH (ref 65–99)

## 2015-04-15 NOTE — Care Management Note (Signed)
Case Management Note  Patient Details  Name: Stephen Beasley MRN: [id number] Date of Birth: 07-Dec-1945  Subjective/Objective:                 Patient admitted from home, lives with wife, for hyperglycemia. DC after 1 night stay to home.   Action/Plan:  DC to home self care.  Expected Discharge Date:                  Expected Discharge Plan:  Home/Self Care  In-House Referral:     Discharge planning Services  CM Consult  Post Acute Care Choice:    Choice offered to:     DME Arranged:    DME Agency:     HH Arranged:    HH Agency:     Status of Service:  Completed, signed off  Medicare Important Message Given:    Date Medicare IM Given:    Medicare IM give by:    Date Additional Medicare IM Given:    Additional Medicare Important Message give by:     If discussed at Long Length of Stay Meetings, dates discussed:    Additional Comments:  [name], RN 04/15/2015, 1:37 PM

## 2015-04-15 NOTE — Progress Notes (Signed)
Rodolph BongHarvey W Ponder to be D/C'd Home per MD order.  Discussed with the patient and all questions fully answered.  VSS, Skin clean, dry and intact without evidence of skin break down, no evidence of skin tears noted. IV catheter discontinued intact. Site without signs and symptoms of complications. Dressing and pressure applied.  An After Visit Summary was printed and given to the patient. Patient received prescription.  D/c education completed with patient/family including follow up instructions, medication list, d/c activities limitations if indicated, with other d/c instructions as indicated by MD - patient able to verbalize understanding, all questions fully answered.   Patient instructed to return to ED, call 911, or call MD for any changes in condition.   Patient escorted via WC, and D/C home via private auto.  Pura SpiceJessica K Edwards 04/15/2015 12:09 PM

## 2015-04-15 NOTE — Discharge Summary (Signed)
Physician Discharge Summary   Patient ID: Stephen Beasley MRN: [id number] DOB/AGE: March 25, 1945 70 y.o.  Admit date: 04/14/2015 Discharge date: 04/15/2015  Primary Care Physician:  Fannie Kneeaniella A Zipkin, MD  Discharge Diagnoses:    . DKA, type 1 (HCC)/diabetic ketoacidosis  . Hyperglycemia . Acute kidney injury (HCC) . Hyperkalemia . Essential hypertension . atypical Chest pain   Consults: Cardiology  Recommendations for Outpatient Follow-up:  1. DKA resolved, patient has resumed insulin pump. Patient was recommended to follow-up with PCP 2. Please repeat CBC/BMET at next visit 3.  patient was strongly recommended this NICU medicine stress test with his cardiologist[facility]   DIET: Cart modified diet   Allergies:   Allergies  Allergen Reactions  . Augmentin [Amoxicillin-Pot Clavulanate] Nausea And Vomiting  . Chocolate Nausea And Vomiting    White chocolate  . Lipitor [Atorvastatin] Other (See Comments)    Muscle cramps     DISCHARGE MEDICATIONS: Current Discharge Medication List    CONTINUE these medications which have NOT CHANGED   Details  aspirin 325 MG tablet Take 325 mg by mouth daily.    clonazePAM (KLONOPIN) 1 MG tablet Take 1 mg by mouth 2 (two) times daily. For anxiety    cyanocobalamin (,VITAMIN B-12,) 1000 MCG/ML injection Inject 1,000 mcg into the muscle every 30 (thirty) days. Date every month varies - daughter administers    esomeprazole (NEXIUM) 40 MG capsule Take 40 mg by mouth every evening.     Fluticasone-Salmeterol (ADVAIR) 250-50 MCG/DOSE AEPB Inhale 1 puff into the lungs daily.    Insulin Human (INSULIN PUMP) 100 unit/ml SOLN Inject into the skin continuous. Novolog:  1.5 units/hour    levothyroxine (SYNTHROID, LEVOTHROID) 137 MCG tablet Take 137 mcg by mouth every morning.    mometasone (NASONEX) 50 MCG/ACT nasal spray Place 2 sprays into the nose daily.    morphine (MS CONTIN) 15 MG 12 hr tablet Take 15 mg by mouth every 12  (twelve) hours.    Multiple Vitamin (MULTIVITAMIN WITH MINERALS) TABS Take 1 tablet by mouth every evening.     oxybutynin (DITROPAN-XL) 5 MG 24 hr tablet Take 5 mg by mouth daily.    oxyCODONE-acetaminophen (PERCOCET) 7.5-325 MG tablet Take 1 tablet by mouth every 6 (six) hours as needed.    ramipril (ALTACE) 10 MG capsule Take 20 mg by mouth daily.    simvastatin (ZOCOR) 20 MG tablet Take 20 mg by mouth every morning.      STOP taking these medications     amLODipine (NORVASC) 5 MG tablet          Brief H and P: For complete details please refer to admission H and P, but in brief Patient is a 70 year old male with diabetes mellitus type 1 on insulin pump, hypertension, dysrhythmia status post ablation, GERD, [facility] with chest pain and hyperglycemia. Patient reported that he was in his baseline state of health until evening, 2 days ago when he noticed his blood sugars were trending up initially in 300s. He tried giving himself additional insulin. He also reported intermittent chest pain radiating into the left arm with nausea. In ED, patient was found to have blood glucose 694, anion gap 15, CO2 19, UA positive for ketones. Patient was admitted for chest pain workup and DKA.  Hospital Course:   Chest pain: Somewhat atypical - Risk factors diabetes mellitus, chest pain at rest.  - Cardiology was consulted, recommended stress test, however patient has his  usual cardiologist, Dr. Read Drivers at [facility]. Patient wants to pursue stress test with his own cardiologist. - Serial cardiac enzymes were negative. I discussed in detail with Dr. Royann Shivers, who recommended that patient does not need any emergent inpatient cardiac workup and can have outpatient echocardiogram or stress test with Dr. Arvilla Market. Cardiology recommended starting patient on beta blocker for angina however BP has been somewhat borderline soft through the hospitalization,  hence will defer to patient's cardiologist, Dr. Arvilla Market.  DKA in type I diabetic: Patient's initial blood glucose was noted to be 694, anion gap 15, CO2 19, BUN 26. UA positive for ketones  - Insulin pump was placed on hold and patient was placed on IV insulin drip. Once gap was closed and 4 consecutive CBG readings less than 1 ED, patient was transitioned to subcutaneous insulin with his pump.  Unclear what triggered DKA. Blood sugars has been stable this morning. Hemoglobin A1c 7.2. Patient will follow up with his primary care physician at [facility] regarding any further adjustment in insulin pump.  Essential hypertension - Continue ramipril. BP was borderline soft hence the amlodipine based on hold.  Hypothyroidism - Continue levothyroxine  Hyperkalemia: Resolved. Initial potassium 5.4 with signs of peaked T waves on EKG. Repeat potassium 5.1 - Resolved with insulin drip  Acute kidney injury: Resolved, likely prerenal.. Initial creatinine 1.44  - Resolved with IV fluid hydration, creatinine 0.94 at the time of discharge  Hyperlipidemia -Continue Zocor  Day of Discharge BP 115/58 mmHg  Pulse 70  Temp(Src) 97.9 F (36.6 C) (Oral)  Resp 16  Ht  (1.93 m)  Wt 98.3 kg (216 lb 11.4 oz)  BMI 26.39 kg/m2  SpO2 97%  Physical Exam: General: Alert and awake oriented x3 not in any acute distress. HEENT: anicteric sclera, pupils reactive to light and accommodation CVS: S1-S2 clear no murmur rubs or gallops Chest: clear to auscultation bilaterally, no wheezing rales or rhonchi Abdomen: soft nontender, nondistended, normal bowel sounds Extremities: no cyanosis, clubbing or edema noted bilaterally Neuro: Cranial nerves II-XII intact, no focal neurological deficits   The results of significant diagnostics from this hospitalization (including imaging, microbiology, ancillary and laboratory) are listed below for reference.    LAB RESULTS: Basic Metabolic Panel:  Recent Labs Lab  04/14/15 0257  04/14/15 1800 04/15/15 0510  NA 134*  < > 141 141  K 5.4*  < > 3.7 3.8  CL 100*  < > 110 111  CO2 19*  < > 22 20*  GLUCOSE 694*  < > 138* 122*  BUN 26*  < > 21* 17  CREATININE 1.44*  < > 1.08 0.94  CALCIUM 9.2  < > 8.8* 8.4*  MG 2.1  --   --   --   < > = values in this interval not displayed. Liver Function Tests: No results for input(s): AST, ALT, ALKPHOS, BILITOT, PROT, ALBUMIN in the last 168 hours. No results for input(s): LIPASE, AMYLASE in the last 168 hours. No results for input(s): AMMONIA in the last 168 hours. CBC:  Recent Labs Lab 04/14/15 0257 04/14/15 0307  WBC 10.5  --   NEUTROABS 8.8*  --   HGB 12.4* 13.9  HCT 37.7* 41.0  MCV 92.6  --   PLT 183  --    Cardiac Enzymes:  Recent Labs Lab 04/14/15 0818 04/14/15 1148  TROPONINI <0.03 <0.03   BNP: Invalid input(s): POCBNP CBG:  Recent Labs Lab 04/14/15 2224 04/15/15 0842  GLUCAP 111* 105*  Significant Diagnostic Studies:  Dg Chest 2 View  04/14/2015  CLINICAL DATA:  Chest pain and shortness of breath EXAM: CHEST  2 VIEW COMPARISON:  03/07/2012 FINDINGS: Normal heart size and mediastinal contours. Chronic mild basilar scarring, likely in the middle lobe. No acute infiltrate or edema. No effusion or pneumothorax. No acute osseous findings. IMPRESSION: No active cardiopulmonary disease. Electronically Signed   By: Marnee Spring M.D.   On: 04/14/2015 03:19    2D ECHO:   Disposition and Follow-up: Discharge Instructions    Diet Carb Modified    Complete by:  As directed      Discharge instructions    Complete by:  As directed   It is VERY IMPORTANT that you follow up with a PCP on a regular basis.  Check your blood glucoses before each meal and at bedtime and maintain a log of your readings.  Bring this log with you when you follow up with your PCP so that he or she can adjust your insulin at your follow up visit.  Please HOLD amlodipine, your BP has been soft. Please follow-up  with your cardiologist at [facility] regarding stress test in 1-2 weeks     Increase activity slowly    Complete by:  As directed             DISPOSITION: home    DISCHARGE FOLLOW-UP Follow-up Information    Follow up with Daniella A Zipkin. Schedule an appointment as soon as possible for a visit in 10 days.   Specialty:  Internal Medicine   Why:  for hospital follow-up   Contact information:   [address] [place] [address] [phone]       Follow up with Read Drivers, Valarie Merino, MD. Schedule an appointment as soon as possible for a visit in 10 days.   Specialty:  Cardiology   Why:  for hospital follow-up and tress test   Contact information:   [address] [facility] [address] [facility] 50[address]2G [facility] [address] [place] [address] [phone]        Time spent on Discharge:   Signed:   Shawnda Mauney M.D. [facility] 04/15/2015, 11:56 AM Pager: [phone]

## 2015-04-15 NOTE — Progress Notes (Signed)
Dr. Isidoro Donningai called to let me know that patient does not need ECHO.  Called [facility] to let them know.  Patient was already down there but test had not been started.  [facility] is bring patient back up

## 2015-04-15 NOTE — Progress Notes (Signed)
Patient Name: Stephen Beasley Date of Encounter: 04/15/2015   Primary Cardiologist: Dr. Read DriversFrazier-Mills ([facility]) Patient Profile: This is a 70 y.o. male with a past medical history significant for late onset type 1 diabetes mellitus, currently on an insulin pump, presenting with severe hyperglycemia, mild hyperkalemia and pressure-like retrosternal chest pain. He reports that he has experienced similar, less severe chest symptoms whenever his glucose exceeds 300. He has not had these symptoms or glucose elevated to this degree (694) except when the diagnosis was initially made. His chest discomfort has steadily improved as his glucose levels have been decreasing. Troponin negative x3.   SUBJECTIVE: Says he feels good.  Had an episode of chest pressure this morning around 5 am, no associated SOB, n/v or diaphoresis.    OBJECTIVE Filed Vitals:   04/14/15 2100 04/14/15 2146 04/15/15 0527 04/15/15 0927  BP: 103/59 104/56 115/58   Pulse: 62 70 70   Temp:  100 F (37.8 C) 97.9 F (36.6 C)   TempSrc:  Oral Oral   Resp: 16 17 16    Height:   6\' 4"  (1.93 m)   Weight:   216 lb 11.4 oz (98.3 kg)   SpO2: 96% 98% 95% 97%    Intake/Output Summary (Last 24 hours) at 04/15/15 0945 Last data filed at 04/15/15 [id number]  Gross per 24 hour  Intake 2059.17 ml  Output    508 ml  Net 1551.17 ml   Filed Weights   04/15/15 0527  Weight: 216 lb 11.4 oz (98.3 kg)    PHYSICAL EXAM General: Well developed, well nourished, male in no acute distress. Head: Normocephalic, atraumatic.  Neck: Supple without bruits, No JVD. Lungs:  Resp regular and unlabored, CTA. Heart: RRR, S1, S2, no S3, S4, No murmur; no rub. Abdomen: Soft, non-tender, non-distended, BS + x 4.  Extremities: No clubbing, cyanosis, No edema.  Neuro: Alert and oriented X 3. Moves all extremities spontaneously. Psych: Normal affect.  LABS: CBC: Recent Labs  04/14/15 0257 04/14/15 0307  WBC 10.5  --   NEUTROABS 8.8*  --   HGB 12.4*  13.9  HCT 37.7* 41.0  MCV 92.6  --   PLT 183  --    Basic Metabolic Panel: Recent Labs  04/14/15 0257  04/14/15 1800 04/15/15 0510  NA 134*  < > 141 141  K 5.4*  < > 3.7 3.8  CL 100*  < > 110 111  CO2 19*  < > 22 20*  GLUCOSE 694*  < > 138* 122*  BUN 26*  < > 21* 17  CREATININE 1.44*  < > 1.08 0.94  CALCIUM 9.2  < > 8.8* 8.4*  MG 2.1  --   --   --   < > = values in this interval not displayed.  Cardiac Enzymes: Recent Labs  04/14/15 0517 04/14/15 0818 04/14/15 1148  TROPONINI <0.03 <0.03 <0.03    Recent Labs  04/14/15 0305  TROPIPOC 0.01   Hemoglobin A1C: Recent Labs  04/14/15 1527  HGBA1C 7.2*    TELE: NSR       ECG: NSR  Radiology/Studies: Dg Chest 2 View  04/14/2015  CLINICAL DATA:  Chest pain and shortness of breath EXAM: CHEST  2 VIEW COMPARISON:  03/07/2012 FINDINGS: Normal heart size and mediastinal contours. Chronic mild basilar scarring, likely in the middle lobe. No acute infiltrate or edema. No effusion or pneumothorax. No acute osseous findings. IMPRESSION: No active cardiopulmonary disease. Electronically Signed   By: Marja KaysJonathon  Watts M.D.   On: 04/14/2015 03:19     Current Medications:  . aspirin  325 mg Oral Daily  . clonazePAM  1 mg Oral BID  . enoxaparin (LOVENOX) injection  40 mg Subcutaneous Q24H  . fluticasone furoate-vilanterol  1 puff Inhalation Daily  . insulin pump   Subcutaneous TID AC, HS, 0200  . levothyroxine  137 mcg Oral QAC breakfast  . morphine  15 mg Oral Q12H  . multivitamin with minerals  1 tablet Oral QPM  . oxybutynin  5 mg Oral Daily  . pantoprazole  80 mg Oral QPM  . ramipril  20 mg Oral Daily  . simvastatin  20 mg Oral q morning - 10a  . sodium chloride flush  3 mL Intravenous Q12H   . sodium chloride 125 mL/hr at 04/15/15 0156    ASSESSMENT AND PLAN: Principal Problem:   Chest pain Active Problems:   Hyperglycemia   DKA, type 1 (HCC)   Acute kidney injury (HCC)   Hyperkalemia   Essential  hypertension   DKA (diabetic ketoacidoses) (HCC)  1. Chest pain at rest without high risk features on ECG and biochemical markers - Will need to follow up with Dr. Lollie Sails at [facility] for repeat nuclear stress test.  - Sotalol was d/c'd, question as to whether he was taking it at home. Per patient he does not take this at home - Would recommend starting BB   2. DKA in type I diabetes - Gap is closed.  - Mgmt per IM   Signed, Little Ishikawa ,NP  9:45 AM 04/15/2015  I have seen and examined the patient along with Little Ishikawa ,NP.  I have reviewed the chart, notes and new data.  I agree with PA/NP's note.  Clinically better. Low risk ECG and biomarkers.  PLAN: Further workup for chest pain is appropriate as an outpatient and can be done with his usual Cardiologist at [facility]. BO is low. Would not add beta blocker at this point, but may consider replacing amlodipine with carvedilol if his Cardiologist thinks it is appropriate.  Thurmon Fair, MD, [facility] [phone] 04/15/2015, 12:38 PM

## 2015-04-15 NOTE — Progress Notes (Signed)
NURSING PROGRESS NOTE  Stephen Beasley [id number] Admission Data: 04/15/2015 1:43 AM Attending Provider: Cathren Harshipudeep K Rai, MD WUJ:[id number]:PROVIDER NOT IN SYSTEM Code Status: Full  Allergies:  Augmentin; Chocolate; and Lipitor Past Medical History:   has a past medical history of Hypertension; Dysrhythmia; Multiple allergies (01-26-12); GERD (gastroesophageal reflux disease); Arthritis; Diabetes mellitus without complication (HCC); Kidney stone; and Hyperthyroidism. Past Surgical History:   has past surgical history that includes Cardiac catheterization; Tonsillectomy; Fracture surgery; Appendectomy; Hernia repair; Knee arthroscopy; Cervical fusion; and Shoulder open rotator cuff repair (01/30/2012). Social History:   reports that he quit smoking about 33 years ago. His smoking use included Cigarettes. He smoked 0.50 packs per day. He does not have any smokeless tobacco history on file. He reports that he drinks alcohol. He reports that he does not use illicit drugs.  Stephen Beasley is a 70 y.o. male patient admitted from ED:   Last Documented Vital Signs: Blood pressure 104/56, pulse 70, temperature 100 F (37.8 C), temperature source Oral, resp. rate 17, SpO2 98 %.  Cardiac Monitoring: Box # 01 in place. Cardiac monitor yields:normal sinus rhythm.  IV Fluids:  IV in place, occlusive dsg intact without redness, IV cath hand right, condition patent and no redness normal saline.   Skin: Intact with insulin pump on right upper abdomin and pump sensor on right lower abdomin.  Patient orientated to room. Information packet given to patient. Admission inpatient armband information verified with patient to include name and date of birth and placed on patient arm. Side rails up x 2, fall assessment and education completed with patient. Patient able to verbalize understanding of risk associated with falls and verbalized understanding to call for assistance before getting out of bed. Call light within reach.  Patient able to voice and demonstrate understanding of unit orientation instructions.    Will continue to evaluate and treat per MD orders.  Sue LushKaelin Romesberg RN, BSN

## 2018-07-30 ENCOUNTER — Emergency Department
Admission: EM | Admit: 2018-07-30 | Discharge: 2018-07-31 | Disposition: A | Discharge status: Other Acute Inpatient Hospital | Payer: Medicare HMO | Attending: Emergency Medicine | Admitting: Emergency Medicine

## 2018-07-30 ENCOUNTER — Emergency Department: Payer: Medicare HMO

## 2018-07-30 ENCOUNTER — Other Ambulatory Visit: Payer: Self-pay

## 2018-07-30 DIAGNOSIS — Z79899 Other long term (current) drug therapy: Secondary | ICD-10-CM | POA: Insufficient documentation

## 2018-07-30 DIAGNOSIS — Y929 Unspecified place or not applicable: Secondary | ICD-10-CM | POA: Insufficient documentation

## 2018-07-30 DIAGNOSIS — E119 Type 2 diabetes mellitus without complications: Secondary | ICD-10-CM | POA: Diagnosis not present

## 2018-07-30 DIAGNOSIS — Y9389 Activity, other specified: Secondary | ICD-10-CM | POA: Insufficient documentation

## 2018-07-30 DIAGNOSIS — Z7982 Long term (current) use of aspirin: Secondary | ICD-10-CM | POA: Diagnosis not present

## 2018-07-30 DIAGNOSIS — Z87891 Personal history of nicotine dependence: Secondary | ICD-10-CM | POA: Insufficient documentation

## 2018-07-30 DIAGNOSIS — Y999 Unspecified external cause status: Secondary | ICD-10-CM | POA: Diagnosis not present

## 2018-07-30 DIAGNOSIS — S61231A Puncture wound without foreign body of left index finger without damage to nail, initial encounter: Secondary | ICD-10-CM | POA: Diagnosis not present

## 2018-07-30 DIAGNOSIS — Z794 Long term (current) use of insulin: Secondary | ICD-10-CM | POA: Insufficient documentation

## 2018-07-30 DIAGNOSIS — I10 Essential (primary) hypertension: Secondary | ICD-10-CM | POA: Insufficient documentation

## 2018-07-30 DIAGNOSIS — W3400XA Accidental discharge from unspecified firearms or gun, initial encounter: Secondary | ICD-10-CM | POA: Diagnosis not present

## 2018-07-30 LAB — CBC
HCT: 38.1 % — ABNORMAL LOW (ref 39.0–52.0)
Hemoglobin: 12.7 g/dL — ABNORMAL LOW (ref 13.0–17.0)
MCH: 30.8 pg (ref 26.0–34.0)
MCHC: 33.3 g/dL (ref 30.0–36.0)
MCV: 92.3 fL (ref 80.0–100.0)
Platelets: 194 10*3/uL (ref 150–400)
RBC: 4.13 MIL/uL — ABNORMAL LOW (ref 4.22–5.81)
RDW: 12.7 % (ref 11.5–15.5)
WBC: 7.3 10*3/uL (ref 4.0–10.5)
nRBC: 0 % (ref 0.0–0.2)

## 2018-07-30 LAB — BASIC METABOLIC PANEL
Anion gap: 8 (ref 5–15)
BUN: 16 mg/dL (ref 8–23)
CO2: 22 mmol/L (ref 22–32)
Calcium: 9.1 mg/dL (ref 8.9–10.3)
Chloride: 107 mmol/L (ref 98–111)
Creatinine, Ser: 0.97 mg/dL (ref 0.61–1.24)
GFR calc Af Amer: >60 mL/min (ref >60)
GFR calc non Af Amer: >60 mL/min (ref >60)
Glucose, Bld: 228 mg/dL — ABNORMAL HIGH (ref 70–99)
Potassium: 4 mmol/L (ref 3.5–5.1)
Sodium: 137 mmol/L (ref 135–145)

## 2018-07-30 LAB — PROTIME-INR
INR: 1.1 (ref 0.8–1.2)
Prothrombin Time: 14.2 seconds (ref 11.4–15.2)

## 2018-07-30 LAB — GLUCOSE, CAPILLARY: Glucose-Capillary: 206 mg/dL — ABNORMAL HIGH (ref 70–99)

## 2018-07-30 MED ORDER — MORPHINE SULFATE (PF) 2 MG/ML IV SOLN
2.0000 mg | Freq: Once | INTRAVENOUS | Status: AC
  Administered 2018-07-30: 23:00:00 2 mg via INTRAVENOUS
  Filled 2018-07-30: qty 1
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
## 2018-07-30 NOTE — ED Notes (Signed)
PD at bedside speaking with pt.

## 2018-07-30 NOTE — ED Notes (Signed)
Pt has good feeling on all sides of finger. Good blood flow/cap refil
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
## 2018-07-31 MED ORDER — MORPHINE SULFATE (PF) 4 MG/ML IV SOLN
INTRAVENOUS | Status: AC
  Administered 2018-07-31: 4 mg via INTRAVENOUS
  Filled 2018-07-31: qty 1

## 2018-07-31 MED ORDER — MORPHINE SULFATE (PF) 4 MG/ML IV SOLN
4.0000 mg | Freq: Once | INTRAVENOUS | Status: AC
  Administered 2018-07-31: 01:00:00 4 mg via INTRAVENOUS

## 2018-07-31 NOTE — ED Notes (Signed)
Pressure dressing applied to pts finger at this time
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
## 2021-12-08 ENCOUNTER — Ambulatory Visit
Payer: No Typology Code available for payment source | Admitting: Student in an Organized Health Care Education/Training Program
